# Patient Record
Sex: Male | Born: 1985 | Race: White | Hispanic: No | Marital: Married | Smoking: Former smoker
Health system: Southern US, Community
[De-identification: ages and names within clinical notes are randomized; demographics above are authoritative.]

## PROBLEM LIST (undated history)

## (undated) HISTORY — PX: NO PAST SURGERIES: SHX2092

---

## 2006-12-12 ENCOUNTER — Emergency Department: Payer: Self-pay | Admitting: Emergency Medicine

## 2008-02-26 ENCOUNTER — Emergency Department: Payer: Self-pay | Admitting: Emergency Medicine

## 2008-02-26 ENCOUNTER — Other Ambulatory Visit: Payer: Self-pay

## 2009-04-17 ENCOUNTER — Emergency Department: Payer: Self-pay | Admitting: Emergency Medicine

## 2011-08-04 ENCOUNTER — Ambulatory Visit: Payer: Self-pay | Admitting: Psychiatry

## 2015-04-29 ENCOUNTER — Encounter: Payer: Self-pay | Admitting: Family Medicine

## 2015-04-29 ENCOUNTER — Ambulatory Visit (INDEPENDENT_AMBULATORY_CARE_PROVIDER_SITE_OTHER): Payer: Managed Care, Other (non HMO) | Admitting: Family Medicine

## 2015-04-29 VITALS — BP 130/80 | HR 74 | Temp 98.8°F | Resp 16 | Ht 75.0 in | Wt 181.0 lb

## 2015-04-29 DIAGNOSIS — F419 Anxiety disorder, unspecified: Secondary | ICD-10-CM | POA: Insufficient documentation

## 2015-04-29 DIAGNOSIS — F329 Major depressive disorder, single episode, unspecified: Secondary | ICD-10-CM | POA: Insufficient documentation

## 2015-04-29 DIAGNOSIS — F418 Other specified anxiety disorders: Secondary | ICD-10-CM | POA: Diagnosis not present

## 2015-04-29 DIAGNOSIS — G2581 Restless legs syndrome: Secondary | ICD-10-CM

## 2015-04-29 DIAGNOSIS — F410 Panic disorder [episodic paroxysmal anxiety] without agoraphobia: Secondary | ICD-10-CM

## 2015-04-29 MED ORDER — CLONAZEPAM 1 MG PO TABS: 1.0000 mg | ORAL_TABLET | Freq: Two times a day (BID) | ORAL | Status: DC | PRN

## 2015-04-29 NOTE — Progress Notes (Signed)
       Patient: Edward Blake Male    DOB: [birth year]   29 y.o.   MRN: [id number] Visit Date: 04/29/2015  Today's Provider: Mila Merry, MD   Chief Complaint  Patient presents with  . Follow-up    Medication  . Anxiety   Subjective:    HPI  Follow-up for Anxiety from 11/10/2013; patient is currently taking Clonazepam 1 mg 1-2 tablets prn. This had been prescribed by Dr. Noberto Retort who is no longer following patient. He has been taking the same medication for a few years and we previously discussed getting refills prescribed her since he has been doing so well on current medications. He states he usually takes one clonazepam every morning, and usually 1/2 at night to help sleep.He does admit to occasional use of marijuana, but has agreed to stop since he is taking a controlled substance.    No Known Allergies Previous Medications   CLONAZEPAM (KLONOPIN) 1 MG TABLET    1 tablet 2 (two) times daily as needed.    Review of Systems  Cardiovascular: Negative for chest pain and palpitations.  Neurological: Negative for dizziness, light-headedness and headaches.  Psychiatric/Behavioral: Negative for sleep disturbance.    Social History  Substance Use Topics  . Smoking status: Current Every Day Smoker -- 0.25 packs/day    Types: Cigarettes  . Smokeless tobacco: Current User  . Alcohol Use: 0.0 oz/week    0 Standard drinks or equivalent per week     Comment: RARELY   Objective:   BP 130/80 mmHg  Pulse 74  Temp(Src) 98.8 F (37.1 C) (Oral)  Resp 16  Ht  (1.905 m)  Wt 181 lb (82.101 kg)  BMI 22.62 kg/m2  SpO2 97%  Physical Exam  General appearance: alert, well developed, well nourished, cooperative and in no distress Head: Normocephalic, without obvious abnormality, atraumatic Lungs: Respirations even and unlabored Extremities: No gross deformities Skin: Skin color, texture, turgor normal. No rashes seen  Psych: Appropriate mood and affect. Neurologic: Mental status:  Alert, oriented to person, place, and time, thought content appropriate.     Assessment & Plan:     1. Other specified anxiety disorders Doing well on current dose of clonazepam.  - Pain Management Screening Profile (10S) - clonazePAM (KLONOPIN) 1 MG tablet; Take 1 tablet (1 mg total) by mouth 2 (two) times daily as needed.  Dispense: 60 tablet; Refill: 3       Mila Merry, MD  [facility]

## 2015-05-02 LAB — PMP SCREEN PROFILE (10S), URINE
Amphetamine Screen, Ur: NEGATIVE ng/mL
Barbiturate Screen, Ur: NEGATIVE ng/mL
Benzodiazepine Screen, Urine: NEGATIVE ng/mL
COCAINE(METAB.) SCREEN, URINE: NEGATIVE ng/mL
Cannabinoids Ur Ql Scn: NEGATIVE ng/mL
Creatinine(Crt), U: 56.5 mg/dL (ref 20.0–300.0)
METHADONE SCREEN, URINE: NEGATIVE ng/mL
Opiate Scrn, Ur: NEGATIVE ng/mL
Oxycodone+Oxymorphone Ur Ql Scn: NEGATIVE ng/mL
PCP Scrn, Ur: NEGATIVE ng/mL
Ph of Urine: 6.7 (ref 4.5–8.9)
Propoxyphene, Screen: NEGATIVE ng/mL

## 2015-05-05 ENCOUNTER — Ambulatory Visit: Payer: Self-pay | Admitting: Family Medicine

## 2015-08-18 ENCOUNTER — Other Ambulatory Visit: Payer: Self-pay | Admitting: Family Medicine

## 2015-08-18 DIAGNOSIS — F418 Other specified anxiety disorders: Secondary | ICD-10-CM

## 2015-08-18 NOTE — Telephone Encounter (Signed)
Pt called for refill on his clonazePAM (KLONOPIN) 1 MG tablet... He's not quite out yet.  [facility] [address]  His call back is [phone]  Thanks Barth Kirkseri

## 2015-08-19 MED ORDER — CLONAZEPAM 1 MG PO TABS: 1.0000 mg | ORAL_TABLET | Freq: Two times a day (BID) | ORAL | Status: DC | PRN

## 2015-08-19 NOTE — Telephone Encounter (Signed)
Please call in clonazepam  

## 2015-08-19 NOTE — Telephone Encounter (Signed)
Rx called in to pharmacy. 

## 2015-09-16 ENCOUNTER — Ambulatory Visit: Payer: Managed Care, Other (non HMO) | Admitting: Family Medicine

## 2015-10-14 ENCOUNTER — Encounter: Payer: Self-pay | Admitting: Family Medicine

## 2015-10-14 ENCOUNTER — Ambulatory Visit (INDEPENDENT_AMBULATORY_CARE_PROVIDER_SITE_OTHER): Payer: Managed Care, Other (non HMO) | Admitting: Family Medicine

## 2015-10-14 VITALS — BP 118/74 | HR 93 | Temp 98.1°F | Resp 16 | Ht 75.0 in | Wt 182.0 lb

## 2015-10-14 DIAGNOSIS — Z23 Encounter for immunization: Secondary | ICD-10-CM | POA: Diagnosis not present

## 2015-10-14 DIAGNOSIS — Z720 Tobacco use: Secondary | ICD-10-CM | POA: Insufficient documentation

## 2015-10-14 DIAGNOSIS — F418 Other specified anxiety disorders: Secondary | ICD-10-CM | POA: Diagnosis not present

## 2015-10-14 NOTE — Progress Notes (Signed)
       Patient: Edward Blake Male    DOB: Dec 19, 1985   30 y.o.   MRN: [id number] Visit Date: 10/14/2015  Today's Provider: Mila Merry, MD   Chief Complaint  Patient presents with  . Follow-up  . Anxiety   Subjective:    HPI  Follow-up for anxiety from 04/29/2015; no changes, continue clonazepam 1 mg prn. He always takes once in the morning. Occasional takes one during the day or one at bedtime to help him relax to sleep easier. No adverse effect from medication.    No Known Allergies Previous Medications   CLONAZEPAM (KLONOPIN) 1 MG TABLET    Take 1 tablet (1 mg total) by mouth 2 (two) times daily as needed.    Review of Systems  Constitutional: Negative for fever, chills and appetite change.  Respiratory: Negative for chest tightness, shortness of breath and wheezing.   Cardiovascular: Negative for chest pain and palpitations.  Gastrointestinal: Negative for nausea, vomiting and abdominal pain.    Social History  Substance Use Topics  . Smoking status: Current Every Day Smoker -- 0.25 packs/day    Types: Cigarettes  . Smokeless tobacco: Current User  . Alcohol Use: 0.0 oz/week    0 Standard drinks or equivalent per week     Comment: RARELY   Objective:   BP 118/74 mmHg  Pulse 93  Temp(Src) 98.1 F (36.7 C) (Oral)  Resp 16  Ht  (1.905 m)  Wt 182 lb (82.555 kg)  BMI 22.75 kg/m2  SpO2 99%  Physical Exam  General appearance: alert, well developed, well nourished, cooperative and in no distress Head: Normocephalic, without obvious abnormality, atraumatic Lungs: Respirations even and unlabored Extremities: No gross deformities Skin: Skin color, texture, turgor normal. No rashes seen  Psych: Appropriate mood and affect. Neurologic: Mental status: Alert, oriented to person, place, and time, thought content appropriate.      Assessment & Plan:     1. Other specified anxiety disorders Doing well on current dose of clonazepam. Continue unchanged.   2.  Tobacco abuse Printed educational material regarding health hazards of smoking  3. Need for influenza vaccination  - Flu Vaccine QUAD 36+ mos IM       Mila Merry, MD  [facility]

## 2015-10-14 NOTE — Patient Instructions (Signed)
Call [facility] for appointment for consultations with psychologist: [phone]   Smoking Hazards Smoking cigarettes is extremely bad for your health. Tobacco smoke has over 200 known poisons in it. It contains the poisonous gases nitrogen oxide and carbon monoxide. There are over 60 chemicals in tobacco smoke that cause cancer. Some of the chemicals found in cigarette smoke include:   Cyanide.   Benzene.   Formaldehyde.   Methanol (wood alcohol).   Acetylene (fuel used in welding torches).   Ammonia.  Even smoking lightly shortens your life expectancy by several years. You can greatly reduce the risk of medical problems for you and your family by stopping now. Smoking is the most preventable cause of death and disease in our society. Within days of quitting smoking, your circulation improves, you decrease the risk of having a heart attack, and your lung capacity improves. There may be some increased phlegm in the first few days after quitting, and it may take months for your lungs to clear up completely. Quitting for 10 years reduces your risk of developing lung cancer to almost that of a nonsmoker.  WHAT ARE THE RISKS OF SMOKING? Cigarette smokers have an increased risk of many serious medical problems, including:  Lung cancer.   Lung disease (such as pneumonia, bronchitis, and emphysema).   Heart attack and chest pain due to the heart not getting enough oxygen (angina).   Heart disease and peripheral blood vessel disease.   Hypertension.   Stroke.   Oral cancer (cancer of the lip, mouth, or voice box).   Bladder cancer.   Pancreatic cancer.   Cervical cancer.   Pregnancy complications, including premature birth.   Stillbirths and smaller newborn babies, birth defects, and genetic damage to sperm.   Early menopause.   Lower estrogen level for women.   Infertility.   Facial wrinkles.   Blindness.   Increased risk of  broken bones (fractures).   Senile dementia.   Stomach ulcers and internal bleeding.   Delayed wound healing and increased risk of complications during surgery. Because of secondhand smoke exposure, children of smokers have an increased risk of the following:   Sudden infant death syndrome (SIDS).   Respiratory infections.   Lung cancer.   Heart disease.   Ear infections.  WHY IS SMOKING ADDICTIVE? Nicotine is the chemical agent in tobacco that is capable of causing addiction or dependence. When you smoke and inhale, nicotine is absorbed rapidly into the bloodstream through your lungs. Both inhaled and noninhaled nicotine may be addictive.  WHAT ARE THE BENEFITS OF QUITTING?  There are many health benefits to quitting smoking. Some are:   The likelihood of developing cancer and heart disease decreases. Health improvements are seen almost immediately.   Blood pressure, pulse rate, and breathing patterns start returning to normal soon after quitting.   People who quit may see an improvement in their overall quality of life.  HOW DO YOU QUIT SMOKING? Smoking is an addiction with both physical and psychological effects, and longtime habits can be hard to change. Your health care provider can recommend:  Programs and community resources, which may include group support, education, or therapy.  Replacement products, such as patches, gum, and nasal sprays. Use these products only as directed. Do not replace cigarette smoking with electronic cigarettes (commonly called e-cigarettes). The safety of e-cigarettes is unknown, and some may contain harmful chemicals. FOR MORE INFORMATION  American Lung Association: www.lung.org  American Cancer Society: www.cancer.org   This information  is not intended to replace advice given to you by your health care provider. Make sure you discuss any questions you have with your health care provider.   Document Released: 09/30/2004 Document  Revised: 06/13/2013 Document Reviewed: 02/12/2013 Elsevier Interactive Patient Education [employer]

## 2015-12-03 ENCOUNTER — Telehealth: Payer: Self-pay | Admitting: Family Medicine

## 2015-12-03 DIAGNOSIS — F418 Other specified anxiety disorders: Secondary | ICD-10-CM

## 2015-12-03 MED ORDER — CLONAZEPAM 1 MG PO TABS: 1.0000 mg | ORAL_TABLET | Freq: Two times a day (BID) | ORAL | Status: DC | PRN

## 2015-12-03 NOTE — Telephone Encounter (Signed)
Pt contacted office for refill request on the following medications: clonazePAM (KLONOPIN) [facility] MG [facility] [address] Pt stated he will run out around 12/13/15 but wanted to go ahead and request refill so it will be at pharmacy by the time he needs it. Thanks TNP

## 2015-12-03 NOTE — Telephone Encounter (Signed)
Please call in clonazepam  

## 2016-03-22 ENCOUNTER — Other Ambulatory Visit: Payer: Self-pay | Admitting: Family Medicine

## 2016-03-22 DIAGNOSIS — F418 Other specified anxiety disorders: Secondary | ICD-10-CM

## 2016-03-22 MED ORDER — CLONAZEPAM 1 MG PO TABS: 1.0000 mg | ORAL_TABLET | Freq: Two times a day (BID) | ORAL | Status: DC | PRN

## 2016-03-22 NOTE — Telephone Encounter (Signed)
Please call in clonazepam  

## 2016-03-22 NOTE — Telephone Encounter (Signed)
Pt contacted office for refill request on the following medications:  clonazePAM (KLONOPIN) 1 MG tablet.  [facility] [address]  [phone]#[phone]/MW

## 2016-04-19 ENCOUNTER — Ambulatory Visit (INDEPENDENT_AMBULATORY_CARE_PROVIDER_SITE_OTHER): Payer: Managed Care, Other (non HMO) | Admitting: Family Medicine

## 2016-04-19 ENCOUNTER — Encounter: Payer: Self-pay | Admitting: Family Medicine

## 2016-04-19 VITALS — BP 122/78 | HR 74 | Temp 98.1°F | Resp 16 | Wt 179.0 lb

## 2016-04-19 DIAGNOSIS — F418 Other specified anxiety disorders: Secondary | ICD-10-CM | POA: Diagnosis not present

## 2016-04-19 DIAGNOSIS — S61431A Puncture wound without foreign body of right hand, initial encounter: Secondary | ICD-10-CM | POA: Diagnosis not present

## 2016-04-19 DIAGNOSIS — Z23 Encounter for immunization: Secondary | ICD-10-CM | POA: Diagnosis not present

## 2016-04-19 NOTE — Progress Notes (Signed)
       Patient: Edward RavensJames D Posas Male    DOB: 10/24/1985   30 y.o.   MRN: [id number] Visit Date: 04/19/2016  Today's Provider: Mila Merryonald Mars Scheaffer, MD   Chief Complaint  Patient presents with  . Anxiety    follow up   Subjective:    HPI Follow up Anxiety:  Patient was last seen 6 months ago and no changes were made. Patient reports good compliance with treatment, good tolerance and good symptom control.   He punctured palm of right hand this morning at work. Cleaned it well with alcohol and water. Last tetanus was in 2012.     No Known Allergies Current Meds  Medication Sig  . clonazePAM (KLONOPIN) 1 MG tablet Take 1 tablet (1 mg total) by mouth 2 (two) times daily as needed.    Review of Systems  Constitutional: Negative for appetite change, chills and fever.  Respiratory: Negative for chest tightness, shortness of breath and wheezing.   Cardiovascular: Negative for chest pain and palpitations.  Gastrointestinal: Negative for abdominal pain, nausea and vomiting.  Skin: Positive for wound (right hand).    Social History  Substance Use Topics  . Smoking status: Former Smoker    Packs/day: 0.25    Types: Cigarettes    Start date: 01/05/2016  . Smokeless tobacco: Never Used  . Alcohol use 0.0 oz/week     Comment: RARELY   Objective:   BP 122/78   Pulse 74   Temp 98.1 F (36.7 C) (Oral)   Resp 16   Wt 179 lb (81.2 kg)   SpO2 98% Comment: room air  BMI 22.37 kg/m   GAD 7 : Generalized Anxiety Score 04/19/2016  Nervous, Anxious, on Edge 1  Control/stop worrying 0  Worry too much - different things 1  Trouble relaxing 0  Restless 0  Easily annoyed or irritable 1  Afraid - awful might happen 1  Total GAD 7 Score 4  Anxiety Difficulty Not difficult at all     Physical Exam  General appearance: alert, well developed, well nourished, cooperative and in no distress Head: Normocephalic, without obvious abnormality, atraumatic Respiratory: Respirations even and  unlabored, normal respiratory rate Extremities: No gross deformities Skin: Skin color, texture, turgor normal.Small puncture right proximal palm no erythema, drainage or swelling.  Psych: Appropriate mood and affect. Neurologic: Mental status: Alert, oriented to person, place, and time, thought content appropriate.     Assessment & Plan:     1. Other specified anxiety disorders Doing well on clonazepam. Continue current medications.    2. Puncture wound, hand, right, initial encounter No sign of infection - Td : Tetanus/diphtheria >7yo Preservative  free       Mila Merryonald Marijose Curington, MD  [facility]

## 2016-07-16 ENCOUNTER — Other Ambulatory Visit: Payer: Self-pay | Admitting: Family Medicine

## 2016-07-16 DIAGNOSIS — F418 Other specified anxiety disorders: Secondary | ICD-10-CM

## 2016-07-16 MED ORDER — CLONAZEPAM 1 MG PO TABS: 1.0000 mg | ORAL_TABLET | Freq: Two times a day (BID) | ORAL | 3 refills | Status: DC | PRN

## 2016-07-16 NOTE — Telephone Encounter (Signed)
Pt contacted office for refill request on the following medications: clonazePAM (KLONOPIN) 1 MG tablet [facility] [address] Last written: 03/22/16 with 3 refills Last OV: 04/19/16 Please advise. Thanks TNP

## 2016-07-16 NOTE — Telephone Encounter (Signed)
Rx called in to pharmacy. 

## 2016-07-16 NOTE — Telephone Encounter (Signed)
Please call in clonazepam  

## 2016-11-08 ENCOUNTER — Other Ambulatory Visit: Payer: Self-pay | Admitting: Family Medicine

## 2016-11-08 DIAGNOSIS — F418 Other specified anxiety disorders: Secondary | ICD-10-CM

## 2016-11-08 MED ORDER — CLONAZEPAM 1 MG PO TABS: 1.0000 mg | ORAL_TABLET | Freq: Two times a day (BID) | ORAL | 3 refills | Status: DC | PRN

## 2016-11-08 NOTE — Telephone Encounter (Signed)
Pt requesting refill of clonazePAM (KLONOPIN) 1 MG tablet [facility] [address]  Pt also wants to know when his next appt is needed.

## 2016-11-08 NOTE — Telephone Encounter (Signed)
Med called in

## 2016-11-08 NOTE — Telephone Encounter (Signed)
Pt last OV was 04/2016. Please advise. thanks

## 2017-02-22 ENCOUNTER — Other Ambulatory Visit: Payer: Self-pay | Admitting: Family Medicine

## 2017-02-22 DIAGNOSIS — F418 Other specified anxiety disorders: Secondary | ICD-10-CM

## 2017-02-22 MED ORDER — CLONAZEPAM 1 MG PO TABS: 1.0000 mg | ORAL_TABLET | Freq: Two times a day (BID) | ORAL | 0 refills | Status: DC | PRN

## 2017-02-22 NOTE — Telephone Encounter (Signed)
Please call in clonazepam   Patient needs to schedule follow up office visit.

## 2017-02-22 NOTE — Telephone Encounter (Signed)
Rx called into pharmacy. LMOVM for pt cb and schedule f/u ov.

## 2017-02-22 NOTE — Telephone Encounter (Signed)
Pt contacted office for refill request on the following medications:  clonazePAM (KLONOPIN) 1 MG tablet.  [facility] [address]  CB#[phone]/MW ° °

## 2017-03-07 ENCOUNTER — Ambulatory Visit (INDEPENDENT_AMBULATORY_CARE_PROVIDER_SITE_OTHER): Payer: Managed Care, Other (non HMO) | Admitting: Family Medicine

## 2017-03-07 ENCOUNTER — Encounter: Payer: Self-pay | Admitting: Family Medicine

## 2017-03-07 VITALS — BP 140/84 | HR 84 | Temp 99.3°F | Resp 16 | Ht 75.0 in | Wt 170.0 lb

## 2017-03-07 DIAGNOSIS — F410 Panic disorder [episodic paroxysmal anxiety] without agoraphobia: Secondary | ICD-10-CM | POA: Diagnosis not present

## 2017-03-07 DIAGNOSIS — M549 Dorsalgia, unspecified: Secondary | ICD-10-CM | POA: Insufficient documentation

## 2017-03-07 DIAGNOSIS — M546 Pain in thoracic spine: Secondary | ICD-10-CM

## 2017-03-07 MED ORDER — IBUPROFEN 400 MG PO TABS: ORAL_TABLET | ORAL | 2 refills | Status: DC

## 2017-03-07 MED ORDER — CYCLOBENZAPRINE HCL 5 MG PO TABS: ORAL_TABLET | ORAL | 1 refills | Status: DC

## 2017-03-07 NOTE — Progress Notes (Signed)
Patient: Edward Blake Male    DOB: Dec 06, 1985   31 y.o.   MRN: [id number] Visit Date: 03/07/2017  Today's Provider: Mila Merry, MD   Chief Complaint  Patient presents with  . Follow-up  . Anxiety   Subjective:    Patient wants to discuss his visit to [facility], where he was seen for chest wall pain. Patient also wants to discuss breathing issues that he's been having. Patient states he has been having episodes where he can't catch his breath.   He states he had floating rib that got hung up' and had manipulation done by DO at urgent care, which helped, but is still having constant soreness in back.    Is still smoking a pack every 2-3 days.   Other specified anxiety disorders From 04/19/2016-no changes. Doing well on clonazepam. Continues on clonazepam which he finds effective. Has panic attacks every couple of months mainly defined by feeling anxious and hyperventilating.   No Known Allergies   Current Outpatient Prescriptions:  .  clonazePAM (KLONOPIN) 1 MG tablet, Take 1 tablet (1 mg total) by mouth 2 (two) times daily as needed., Disp: 60 tablet, Rfl: 0  Review of Systems  Constitutional: Negative for appetite change, chills and fever.  Respiratory: Negative for chest tightness, shortness of breath and wheezing.   Cardiovascular: Negative for chest pain and palpitations.  Gastrointestinal: Negative for abdominal pain, nausea and vomiting.    Social History  Substance Use Topics  . Smoking status: Former Smoker    Packs/day: 0.25    Types: Cigarettes    Quit date: 01/05/2016  . Smokeless tobacco: Never Used  . Alcohol use 0.0 oz/week     Comment: RARELY   Objective:   BP (!) 150/90 (BP Location: Right Arm, Patient Position: Sitting, Cuff Size: Normal)   Pulse 84   Temp 99.3 F (37.4 C) (Oral)   Resp 16   Ht 6\' 3"  (1.905 m)   Wt 170 lb (77.1 kg)   SpO2 98%   BMI 21.25 kg/m  Vitals:   03/07/17 1352  BP: (!) 150/90  Pulse: 84  Resp:  16  Temp: 99.3 F (37.4 C)  TempSrc: Oral  SpO2: 98%  Weight: 170 lb (77.1 kg)  Height: 6\' 3"  (1.905 m)   Depression screen [facility] 2/9 03/07/2017  Decreased Interest 0  Down, Depressed, Hopeless 1  PHQ - 2 Score 1  Altered sleeping 0  Tired, decreased energy 0  Change in appetite 0  Feeling bad or failure about yourself  0  Trouble concentrating 0  Moving slowly or fidgety/restless 0  Suicidal thoughts 0  PHQ-9 Score 1  Difficult doing work/chores Not difficult at all     Physical Exam   General Appearance:    Alert, cooperative, no distress  Eyes:    PERRL, conjunctiva/corneas clear, EOM's intact       Lungs:     Clear to auscultation bilaterally, respirations unlabored  Heart:    Regular rate and rhythm  Neurologic:   Awake, alert, oriented x 3. No apparent focal neurological           defect.   MS:   Mild tenderness between left scapula and spine.        Assessment & Plan:     1. Panic disorder Continue current dose of clonazepam. Counseled on slow deep breathing when feeing a panic attack come on. Consider SSRI if they become more frequent.  2. Acute right-sided thoracic back pain Improving slightly since UC visit. Start NSAID and muscle relaxer at bedtime.  - ibuprofen (ADVIL,MOTRIN) 400 MG tablet; One at bedtime and every eight hours during the day as needed  Dispense: 30 tablet; Refill: 2 - cyclobenzaprine (FLEXERIL) 5 MG tablet; 2 at bedtime and 1 every eight hours during day as needed.  Dispense: 30 tablet; Refill: 1       Mila Merryonald Fisher, MD  [facility]

## 2017-03-23 ENCOUNTER — Other Ambulatory Visit: Payer: Self-pay | Admitting: Family Medicine

## 2017-03-23 DIAGNOSIS — F418 Other specified anxiety disorders: Secondary | ICD-10-CM

## 2017-03-23 NOTE — Telephone Encounter (Signed)
Please call in clonazepam  

## 2017-03-24 ENCOUNTER — Telehealth: Payer: Self-pay | Admitting: Family Medicine

## 2017-03-24 DIAGNOSIS — F418 Other specified anxiety disorders: Secondary | ICD-10-CM

## 2017-03-24 NOTE — Telephone Encounter (Signed)
[facility] is [facility] [address] [facility] approve him getting his Rx for Clonzepam refilled 1 day early due to pt going on vacation. Pt would like to pick this up tomorrow. VO#[phone]/QQCB#[phone]/MW

## 2017-03-24 NOTE — Telephone Encounter (Signed)
Rx called in to pharmacy. 

## 2017-03-25 MED ORDER — CLONAZEPAM 1 MG PO TABS: ORAL_TABLET | ORAL | 3 refills | Status: DC

## 2017-03-25 NOTE — Telephone Encounter (Signed)
That's fine

## 2017-03-25 NOTE — Telephone Encounter (Signed)
[facility]. [facility] was printed [facility] pickup [facility] Patient was notified.

## 2017-03-25 NOTE — Telephone Encounter (Signed)
Is this okay to fill early? Please advise.

## 2017-06-22 ENCOUNTER — Ambulatory Visit (INDEPENDENT_AMBULATORY_CARE_PROVIDER_SITE_OTHER): Payer: Managed Care, Other (non HMO) | Admitting: Family Medicine

## 2017-06-22 ENCOUNTER — Encounter: Payer: Self-pay | Admitting: Family Medicine

## 2017-06-22 VITALS — BP 122/62 | HR 95 | Temp 98.3°F | Resp 16 | Ht 75.0 in | Wt 161.0 lb

## 2017-06-22 DIAGNOSIS — F410 Panic disorder [episodic paroxysmal anxiety] without agoraphobia: Secondary | ICD-10-CM

## 2017-06-22 DIAGNOSIS — R634 Abnormal weight loss: Secondary | ICD-10-CM | POA: Diagnosis not present

## 2017-06-22 DIAGNOSIS — Z23 Encounter for immunization: Secondary | ICD-10-CM | POA: Diagnosis not present

## 2017-06-22 DIAGNOSIS — F331 Major depressive disorder, recurrent, moderate: Secondary | ICD-10-CM

## 2017-06-22 MED ORDER — PAROXETINE HCL 10 MG PO TABS: ORAL_TABLET | ORAL | 1 refills | Status: DC

## 2017-06-22 NOTE — Progress Notes (Signed)
error 

## 2017-06-22 NOTE — Progress Notes (Signed)
Patient: Edward RavensJames D Bookbinder Male    DOB: 07/08/1986   31 y.o.   MRN: [id number] Visit Date: 06/22/2017  Today's Provider: Mila Merryonald Glenda Spelman, MD   Chief Complaint  Patient presents with  . Panic Attack   Subjective:    HPI Patient presents today c/o more frequent panic attacks. Patient reports that 2 days ago he had a severe panic attack at work. He reports that he has not had one in several months. He has been taking clonazepam 1mg  as needed up to 2 tablets. He reports that 2 days ago, he had to take 3 tablets in order to calm completely down. He states that he has had some increase stress at work and at home. He reports that he has not had a panic attack since then. He also mentions some associated chest tightness when his panic attack occurred, but it has resolved now. He has also been feeling a little depressed. He is noticed to have steadily lost weight over the last year and states he hasn't had much of an appetite.   He typically takes clonazepam once every morning, and another one a few times a month when he is having more stress than usual.   Wt Readings from Last 5 Encounters:  06/22/17 161 lb (73 kg)  03/07/17 170 lb (77.1 kg)  04/19/16 179 lb (81.2 kg)  10/14/15 182 lb (82.6 kg)  04/29/15 181 lb (82.1 kg)       Dep[facility] screen [facility] 2/9 06/22/2017 03/07/2017  Decreased Interest 0 0  Down, Depressed, Hopeless 0 1  PHQ - 2 Score 0 1  Altered sleeping 0 0  Tired, decreased energy 1 0  Change in appetite 1 0  Feeling bad or failure about yourself  0 0  Trouble concentrating 0 0  Moving slowly or fidgety/restless 0 0  Suicidal thoughts 0 0  PHQ-9 Score 2 1  Difficult doing work/chores Not difficult at all Not difficult at all     No Known Allergies   Current Outpatient Prescriptions:  .  clonazePAM (KLONOPIN) 1 MG tablet, TAKE 1 TABLET BY MOUTH TWICE A DAY AS NEEDED, Disp: 60 tablet, Rfl: 3 .  cyclobenzaprine (FLEXERIL) 5 MG tablet, 2 at bedtime and 1 every eight  hours during day as needed., Disp: 30 tablet, Rfl: 1 .  ibuprofen (ADVIL,MOTRIN) 400 MG tablet, One at bedtime and every eight hours during the day as needed, Disp: 30 tablet, Rfl: 2  Review of Systems  Constitutional: Positive for fatigue. Negative for activity change and appetite change.  Psychiatric/Behavioral: Positive for agitation and sleep disturbance. Negative for behavioral problems, confusion, decreased concentration, dysphoric mood, hallucinations, self-injury and suicidal ideas. The patient is nervous/anxious. The patient is not hyperactive.     Social History  Substance Use Topics  . Smoking status: Former Smoker    Packs/day: 0.25    Types: Cigarettes    Quit date: 01/05/2016  . Smokeless tobacco: Never Used  . Alcohol use 0.0 oz/week     Comment: RARELY   Objective:   BP 122/62 (BP Location: Right Arm, Patient Position: Sitting, Cuff Size: Normal)   Pulse 95   Temp 98.3 F (36.8 C)   Resp 16   Ht 6\' 3"  (1.905 m)   Wt 161 lb (73 kg)   SpO2 99%   BMI 20.12 kg/m     Physical Exam   General Appearance:    Alert, cooperative, no distress  Eyes:    PERRL, conjunctiva/corneas  clear, EOM's intact       Lungs:     Clear to auscultation bilaterally, respirations unlabored  Heart:    Regular rate and rhythm  Neurologic:   Awake, alert, oriented x 3. No apparent focal neurological           defect.           Assessment & Plan:     1. Panic disorder Try adding low dose and slow titration of SSRI. Continue clonazepam for the time being - PARoxetine (PAXIL) 10 MG tablet; 1/2 tablet daily for six days then one daily for six days, then  2 daily  Dispense: 30 tablet; Refill: 1  Return in about 3 weeks (around 07/13/2017).   2. Weight loss  - COMPLETE METABOLIC PANEL WITH GFR - CBC - TSH - HIV antibody (with reflex)  3. Moderate episode of recurrent major depressive disorder (HCC)  - PARoxetine (PAXIL) 10 MG tablet; 1/2 tablet daily for six days then one daily  for six days, then  2 daily  Dispense: 30 tablet; Refill: 1  4. Influenza vaccine needed  - Flu Vaccine QUAD 6+ mos PF IM (Fluarix Quad PF)       Mila Merry, MD  [facility]'

## 2017-06-30 LAB — CBC
HCT: 43.9 % (ref 38.5–50.0)
HEMOGLOBIN: 15.7 g/dL (ref 13.2–17.1)
MCH: 33.6 pg — ABNORMAL HIGH (ref 27.0–33.0)
MCHC: 35.8 g/dL (ref 32.0–36.0)
MCV: 94 fL (ref 80.0–100.0)
MPV: 11.1 fL (ref 7.5–12.5)
PLATELETS: 295 10*3/uL (ref 140–400)
RBC: 4.67 10*6/uL (ref 4.20–5.80)
RDW: 11.9 % (ref 11.0–15.0)
WBC: 7.4 10*3/uL (ref 3.8–10.8)

## 2017-06-30 LAB — COMPLETE METABOLIC PANEL WITH GFR
AG RATIO: 1.9 (calc) (ref 1.0–2.5)
ALT: 15 U/L (ref 9–46)
AST: 18 U/L (ref 10–40)
Albumin: 4.7 g/dL (ref 3.6–5.1)
Alkaline phosphatase (APISO): 41 U/L (ref 40–115)
BILIRUBIN TOTAL: 0.5 mg/dL (ref 0.2–1.2)
BUN: 13 mg/dL (ref 7–25)
CHLORIDE: 102 mmol/L (ref 98–110)
CO2: 29 mmol/L (ref 20–32)
Calcium: 9.4 mg/dL (ref 8.6–10.3)
Creat: 0.92 mg/dL (ref 0.60–1.35)
GFR, Est African American: 129 mL/min/{1.73_m2} (ref >=60)
GFR, Est Non African American: 111 mL/min/{1.73_m2} (ref >=60)
GLUCOSE: 78 mg/dL (ref 65–99)
Globulin: 2.5 g/dL (calc) (ref 1.9–3.7)
Potassium: 3.8 mmol/L (ref 3.5–5.3)
Sodium: 138 mmol/L (ref 135–146)
Total Protein: 7.2 g/dL (ref 6.1–8.1)

## 2017-06-30 LAB — HIV ANTIBODY (ROUTINE TESTING W REFLEX): HIV: NONREACTIVE

## 2017-06-30 LAB — TSH: TSH: 1.02 mIU/L (ref 0.40–4.50)

## 2017-07-10 ENCOUNTER — Encounter: Payer: Self-pay | Admitting: Family Medicine

## 2017-07-14 MED ORDER — SERTRALINE HCL 50 MG PO TABS: ORAL_TABLET | ORAL | 1 refills | Status: DC

## 2017-07-18 ENCOUNTER — Encounter: Payer: Self-pay | Admitting: Family Medicine

## 2017-07-18 ENCOUNTER — Other Ambulatory Visit: Payer: Self-pay | Admitting: Family Medicine

## 2017-07-18 DIAGNOSIS — F418 Other specified anxiety disorders: Secondary | ICD-10-CM

## 2017-07-18 NOTE — Telephone Encounter (Signed)
Please call in clonazepam  

## 2017-07-19 NOTE — Telephone Encounter (Signed)
Called in Rx as below.  

## 2017-07-19 NOTE — Telephone Encounter (Signed)
Done on 07-18-2017

## 2017-07-21 ENCOUNTER — Ambulatory Visit: Payer: Self-pay | Admitting: Family Medicine

## 2017-07-21 ENCOUNTER — Encounter: Payer: Self-pay | Admitting: Family Medicine

## 2017-07-21 NOTE — Progress Notes (Deleted)
       Patient: Edward Blake Male    DOB: 08/05/1986   31 y.o.   MRN: [id number] Visit Date: 07/21/2017  Today's Provider: Mila Merryonald Fisher, MD   No chief complaint on file.  Subjective:    HPI  Panic disorder From 06/22/2017-added low dose and slow titration of SSRI. Continue clonazepam for the time being  Moderate episode of recurrent major depressive disorder (HCC) From 06/22/2017-added Paxil. Changes since last office visit, changed Paxil to sertraline due to side effects.   No Known Allergies   Current Outpatient Medications:  .  clonazePAM (KLONOPIN) 1 MG tablet, TAKE 1 TABLET BY MOUTH TWICE DAILY IF NEEDED, Disp: 60 tablet, Rfl: 3 .  cyclobenzaprine (FLEXERIL) 5 MG tablet, 2 at bedtime and 1 every eight hours during day as needed., Disp: 30 tablet, Rfl: 1 .  ibuprofen (ADVIL,MOTRIN) 400 MG tablet, One at bedtime and every eight hours during the day as needed, Disp: 30 tablet, Rfl: 2 .  sertraline (ZOLOFT) 50 MG tablet, 1/2 tablet daily for 6 days, then 1 daily for six days, then 1 1/2 daily for six days, then 2 daily, Disp: 30 tablet, Rfl: 1  Review of Systems  Constitutional: Negative for appetite change, chills and fever.  Respiratory: Negative for chest tightness, shortness of breath and wheezing.   Cardiovascular: Negative for chest pain and palpitations.  Gastrointestinal: Negative for abdominal pain, nausea and vomiting.    Social History   Tobacco Use  . Smoking status: Former Smoker    Packs/day: 0.25    Types: Cigarettes    Last attempt to quit: 01/05/2016    Years since quitting: 1.5  . Smokeless tobacco: Never Used  Substance Use Topics  . Alcohol use: Yes    Alcohol/week: 0.0 oz    Comment: RARELY   Objective:   There were no vitals taken for this visit. There were no vitals filed for this visit.   Physical Exam      Assessment & Plan:           Mila Merryonald Fisher, MD  [facility]

## 2017-08-04 ENCOUNTER — Ambulatory Visit (INDEPENDENT_AMBULATORY_CARE_PROVIDER_SITE_OTHER): Payer: Managed Care, Other (non HMO) | Admitting: Family Medicine

## 2017-08-04 ENCOUNTER — Encounter: Payer: Self-pay | Admitting: Family Medicine

## 2017-08-04 VITALS — BP 120/70 | HR 66 | Temp 97.9°F | Resp 16 | Ht 75.0 in | Wt 167.0 lb

## 2017-08-04 DIAGNOSIS — F410 Panic disorder [episodic paroxysmal anxiety] without agoraphobia: Secondary | ICD-10-CM

## 2017-08-04 DIAGNOSIS — M778 Other enthesopathies, not elsewhere classified: Secondary | ICD-10-CM

## 2017-08-04 DIAGNOSIS — F418 Other specified anxiety disorders: Secondary | ICD-10-CM

## 2017-08-04 MED ORDER — SERTRALINE HCL 100 MG PO TABS: 100.0000 mg | ORAL_TABLET | Freq: Every day | ORAL | 5 refills | Status: DC

## 2017-08-04 NOTE — Patient Instructions (Signed)
Tendinitis Tendinitis is inflammation of a tendon. A tendon is a strong cord of tissue that connects muscle to bone. Tendinitis can affect any tendon, but it most commonly affects the shoulder tendon (rotator cuff), ankle tendon (Achilles tendon), elbow tendon (triceps tendon), or one of the tendons in the wrist. What are the causes? This condition may be caused by:  Overusing a tendon or muscle. This is common.  Age-related wear and tear.  Injury.  Inflammatory conditions, such as arthritis.  Certain medicines.  What increases the risk? This condition is more likely to develop in people who do activities that involve repetitive motions. What are the signs or symptoms? Symptoms of this condition may include:  Pain.  Tenderness.  Mild swelling.  How is this diagnosed? This condition is diagnosed with a physical exam. You may also have tests, such as:  Ultrasound. This uses sound waves to make an image of your affected area.  MRI.  How is this treated? This condition may be treated by resting, icing, applying pressure (compression), and raising (elevating) the area above the level of your heart. This is known as RICE therapy. Treatment may also include:  Medicines to help reduce inflammation or to help reduce pain.  Exercises or physical therapy to strengthen and stretch the tendon.  A brace or splint.  Surgery (rare).  Follow these instructions at home:  If you have a splint or brace:  Wear the splint or brace as told by your health care provider. Remove it only as told by your health care provider.  Loosen the splint or brace if your fingers or toes tingle, become numb, or turn cold and blue.  Do not take baths, swim, or use a hot tub until your health care provider approves. Ask your health care provider if you can take showers. You may only be allowed to take sponge baths for bathing.  Do not let your splint or brace get wet if it is not waterproof. ? If your  splint or brace is not waterproof, cover it with a watertight plastic bag when you take a bath or a shower.  Keep the splint or brace clean. Managing pain, stiffness, and swelling  If directed, apply ice to the affected area. ? Put ice in a plastic bag. ? Place a towel between your skin and the bag. ? Leave the ice on for 20 minutes, 2-3 times a day.  If directed, apply heat to the affected area as often as told by your health care provider. Use the heat source that your health care provider recommends, such as a moist heat pack or a heating pad. ? Place a towel between your skin and the heat source. ? Leave the heat on for 20-30 minutes. ? Remove the heat if your skin turns bright red. This is especially important if you are unable to feel pain, heat, or cold. You may have a greater risk of getting burned.  Move the fingers or toes of the affected limb often, if this applies. This can help to prevent stiffness and lessen swelling.  If directed, elevate the affected area above the level of your heart while you are sitting or lying down. Driving  Do not drive or operate heavy machinery while taking prescription pain medicine.  Ask your health care provider when it is safe to drive if you have a splint or brace on any part of your arm or leg. Activity  Return to your normal activities as told by your health care   provider. Ask your health care provider what activities are safe for you.  Rest the affected area as told by your health care provider.  Avoid using the affected area while you are experiencing symptoms of tendinitis.  Do exercises as told by your health care provider. General instructions  If you have a splint, do not put pressure on any part of the splint until it is fully hardened. This may take several hours.  Wear an elastic bandage or compression wrap only as told by your health care provider.  Take over-the-counter and prescription medicines only as told by your  health care provider.  Keep all follow-up visits as told by your health care provider. This is important. Contact a health care provider if:  Your symptoms do not improve.  You develop new, unexplained problems, such as numbness in your hands. This information is not intended to replace advice given to you by your health care provider. Make sure you discuss any questions you have with your health care provider. Document Released: 08/20/2000 Document Revised: 04/22/2016 Document Reviewed: 05/26/2015 Elsevier Interactive Patient Education  2018 Elsevier Inc.  

## 2017-08-04 NOTE — Progress Notes (Signed)
       Patient: Edward Blake Male    DOB: 05/03/1986   31 y.o.   MRN: [id number] Visit Date: 08/04/2017  Today's Provider: Mila Merryonald Fisher, MD   Chief Complaint  Patient presents with  . Follow-up  . Depression   Subjective:    HPI  Moderate episode of recurrent major depressive disorder (HCC) and Panic disorder From 06/22/2017-added PARoxetine (PAXIL) 10 MG tablet. Was changed to sertraline about 3 weeks ago due to restless legs and poor appetite. He feels like sertraline is tolerated much better. Has titrated up to 1 1/2 x 50mg  daily feels like mood is more level.    Also having left wrist pain for 2 weeks.Usually hurts when he lifts something heavy or when he's driving. Doesn't recall any specific injury, but has improved quite a bit the last few days.   No Known Allergies   Current Outpatient Medications:  .  clonazePAM (KLONOPIN) 1 MG tablet, TAKE 1 TABLET BY MOUTH TWICE DAILY IF NEEDED, Disp: 60 tablet, Rfl: 3 .  sertraline (ZOLOFT) 50 MG tablet, 1/2 tablet daily for 6 days, then 1 daily for six days, then 1 1/2 daily for six days, then 2 daily, Disp: 30 tablet, Rfl: 1 .  cyclobenzaprine (FLEXERIL) 5 MG tablet, 2 at bedtime and 1 every eight hours during day as needed. (Patient not taking: Reported on 08/04/2017), Disp: 30 tablet, Rfl: 1 .  ibuprofen (ADVIL,MOTRIN) 400 MG tablet, One at bedtime and every eight hours during the day as needed (Patient not taking: Reported on 08/04/2017), Disp: 30 tablet, Rfl: 2  Review of Systems  Constitutional: Negative for appetite change, chills and fever.  Respiratory: Negative for chest tightness, shortness of breath and wheezing.   Cardiovascular: Negative for chest pain and palpitations.  Gastrointestinal: Negative for abdominal pain, nausea and vomiting.  Musculoskeletal: Positive for arthralgias.    Social History   Tobacco Use  . Smoking status: Former Smoker    Packs/day: 0.25    Types: Cigarettes    Last attempt to quit:  01/05/2016    Years since quitting: 1.5  . Smokeless tobacco: Never Used  Substance Use Topics  . Alcohol use: Yes    Alcohol/week: 0.0 oz    Comment: RARELY   Objective:   BP 120/70 (BP Location: Right Arm, Patient Position: Sitting, Cuff Size: Normal)   Pulse 66   Temp 97.9 F (36.6 C) (Oral)   Resp 16   Ht 6\' 3"  (1.905 m)   Wt 167 lb (75.8 kg)   SpO2 99%   BMI 20.87 kg/m     Physical Exam  General appearance: alert, well developed, well nourished, cooperative and in no distress Head: Normocephalic, without obvious abnormality, atraumatic Respiratory: Respirations even and unlabored, normal respiratory rate Extremities: Minimal tenderness dorsum of left medial wrist. No swelling or erythema. FROM with slight pain at limits of wrist flexion and extension against resistance.      Assessment & Plan:     1. Panic disorder Tolerating sertraline better than paroxetine, is to titrate up to 100mg  a day. If continues to do well will follow up in 3 months.  2. Other specified anxiety disorders Continue prn clonazepam.   3. Tendonitis of wrist, left  Improving without treatment. Advise relative rest and expect to completely heal in a few more weeks.       Mila Merryonald Fisher, MD  [facility]

## 2017-08-10 ENCOUNTER — Encounter: Payer: Self-pay | Admitting: Family Medicine

## 2017-08-16 ENCOUNTER — Other Ambulatory Visit: Payer: Self-pay | Admitting: Family Medicine

## 2017-08-16 DIAGNOSIS — M25539 Pain in unspecified wrist: Secondary | ICD-10-CM

## 2017-11-01 ENCOUNTER — Other Ambulatory Visit: Payer: Self-pay | Admitting: Family Medicine

## 2017-11-01 DIAGNOSIS — F410 Panic disorder [episodic paroxysmal anxiety] without agoraphobia: Secondary | ICD-10-CM

## 2017-11-01 MED ORDER — SERTRALINE HCL 100 MG PO TABS: 100.0000 mg | ORAL_TABLET | Freq: Every day | ORAL | 5 refills | Status: DC

## 2017-11-01 NOTE — Telephone Encounter (Signed)
Please review. Thanks!  

## 2017-11-01 NOTE — Telephone Encounter (Signed)
[facility] pharmacy faxed a refill request for a 90-days supply for the following medication. Thanks CC ° °sertraline (ZOLOFT) 100 MG tablet  ° °

## 2017-11-02 ENCOUNTER — Other Ambulatory Visit: Payer: Self-pay | Admitting: Family Medicine

## 2017-11-02 DIAGNOSIS — F410 Panic disorder [episodic paroxysmal anxiety] without agoraphobia: Secondary | ICD-10-CM

## 2017-11-02 MED ORDER — SERTRALINE HCL 100 MG PO TABS: 100.0000 mg | ORAL_TABLET | Freq: Every day | ORAL | 1 refills | Status: DC

## 2017-11-02 NOTE — Telephone Encounter (Signed)
[facility] pharmacy faxed a refill request for a 90-days supply for the following medication. Thanks CC ° °sertraline (ZOLOFT) 100 MG tablet  ° °

## 2017-11-05 ENCOUNTER — Other Ambulatory Visit: Payer: Self-pay | Admitting: Family Medicine

## 2017-11-05 DIAGNOSIS — F418 Other specified anxiety disorders: Secondary | ICD-10-CM

## 2017-11-10 ENCOUNTER — Encounter: Payer: Self-pay | Admitting: Family Medicine

## 2017-11-10 ENCOUNTER — Encounter: Payer: Self-pay | Admitting: *Deleted

## 2017-11-10 ENCOUNTER — Ambulatory Visit (INDEPENDENT_AMBULATORY_CARE_PROVIDER_SITE_OTHER): Payer: Managed Care, Other (non HMO) | Admitting: Family Medicine

## 2017-11-10 VITALS — BP 120/88 | HR 71 | Temp 98.1°F | Resp 16 | Ht 75.0 in | Wt 162.0 lb

## 2017-11-10 DIAGNOSIS — J069 Acute upper respiratory infection, unspecified: Secondary | ICD-10-CM

## 2017-11-10 DIAGNOSIS — F331 Major depressive disorder, recurrent, moderate: Secondary | ICD-10-CM | POA: Diagnosis not present

## 2017-11-10 DIAGNOSIS — F410 Panic disorder [episodic paroxysmal anxiety] without agoraphobia: Secondary | ICD-10-CM | POA: Diagnosis not present

## 2017-11-10 NOTE — Progress Notes (Signed)
Patient: Edward Blake Male    DOB: 07/19/1986   31 y.o.   MRN: [id number] Visit Date: 11/10/2017  Today's Provider: Mila Merryonald Fisher, MD   Chief Complaint  Patient presents with  . Follow-up  . Anxiety  . Depression   Subjective:    HPI  Panic disorder From 08/04/2017-patient was advised to titrate sertraline up to 100mg  a day. If continues to do well will follow up in 3 months.  Other specified anxiety disorders From 08/04/2017-no changes. Continue prn clonazepam. He has been doing well and taking every couple of days for anxious feeling, but has not had any panic attacks.   Also some URI symptoms the last couple of days. Had fever of 102 yesterday morning. Runny nose and headache with persistent non-productive cough. Has had no fever since yesterday morning and not taking any OTC medications. Did miss work yesterday and today.    No Known Allergies   Current Outpatient Medications:  .  clonazePAM (KLONOPIN) 1 MG tablet, TAKE 1 TABLET BY MOUTH TWICE A DAY AS NEEDED, Disp: 60 tablet, Rfl: 3 .  cyclobenzaprine (FLEXERIL) 5 MG tablet, 2 at bedtime and 1 every eight hours during day as needed., Disp: 30 tablet, Rfl: 1 .  ibuprofen (ADVIL,MOTRIN) 400 MG tablet, One at bedtime and every eight hours during the day as needed, Disp: 30 tablet, Rfl: 2 .  sertraline (ZOLOFT) 100 MG tablet, Take 1 tablet (100 mg total) by mouth daily., Disp: 90 tablet, Rfl: 1  Review of Systems  Constitutional: Negative for appetite change, chills and fever.  Respiratory: Negative for chest tightness, shortness of breath and wheezing.   Cardiovascular: Negative for chest pain and palpitations.  Gastrointestinal: Negative for abdominal pain, nausea and vomiting.    Social History   Tobacco Use  . Smoking status: Former Smoker    Packs/day: 0.25    Types: Cigarettes    Last attempt to quit: 01/05/2016    Years since quitting: 1.8  . Smokeless tobacco: Never Used  Substance Use Topics  .  Alcohol use: Yes    Alcohol/week: 0.0 oz    Comment: RARELY   Objective:   BP 120/88 (BP Location: Right Arm, Patient Position: Sitting, Cuff Size: Normal)   Pulse 71   Temp 98.1 F (36.7 C) (Oral)   Resp 16   Ht 6\' 3"  (1.905 m)   Wt 162 lb (73.5 kg)   SpO2 98%   BMI 20.25 kg/m  Vitals:   11/10/17 0818  BP: 120/88  Pulse: 71  Resp: 16  Temp: 98.1 F (36.7 C)  TempSrc: Oral  SpO2: 98%  Weight: 162 lb (73.5 kg)  Height: 6\' 3"  (1.905 m)     Physical Exam  General Appearance:    Alert, cooperative, no distress  HENT:   bilateral TM normal without fluid or infection, neck without nodes, pharynx erythematous without exudate, sinuses nontender and nasal mucosa congested  Eyes:    PERRL, conjunctiva/corneas clear, EOM's intact       Lungs:     Clear to auscultation bilaterally, respirations unlabored  Heart:    Regular rate and rhythm  Neurologic:   Awake, alert, oriented x 3. No apparent focal neurological           defect.           Assessment & Plan:     1. Moderate episode of recurrent major depressive disorder (HCC) Doing much better with increase dose of sertraline and  reducing frequency of clonazepam. Advised to try reducing dose of clonazepam to 1/2 tablet and consider change next prescription to 0.5mg  tablets.   2. Panic disorder Well controlled.  Continue current dose of sertraline.   3. Upper respiratory tract infection, unspecified type Counseled regarding signs and symptoms of viral and bacterial respiratory infections. Advised to call or return for additional evaluation if he develops any sign of bacterial infection, or if current symptoms last longer than 10 days.    Wok excuse given for today and yesterday for URI.       Mila Merry, MD  [facility]

## 2018-02-23 ENCOUNTER — Encounter: Payer: Self-pay | Admitting: Family Medicine

## 2018-02-24 ENCOUNTER — Other Ambulatory Visit: Payer: Self-pay | Admitting: *Deleted

## 2018-02-24 DIAGNOSIS — F418 Other specified anxiety disorders: Secondary | ICD-10-CM

## 2018-02-24 MED ORDER — CLONAZEPAM 1 MG PO TABS: ORAL_TABLET | ORAL | 5 refills | Status: DC

## 2018-02-28 ENCOUNTER — Encounter: Payer: Self-pay | Admitting: Family Medicine

## 2018-03-07 ENCOUNTER — Ambulatory Visit (INDEPENDENT_AMBULATORY_CARE_PROVIDER_SITE_OTHER): Payer: Managed Care, Other (non HMO) | Admitting: Family Medicine

## 2018-03-07 ENCOUNTER — Encounter: Payer: Self-pay | Admitting: Family Medicine

## 2018-03-07 VITALS — BP 104/80 | HR 87 | Temp 98.0°F | Resp 16 | Wt 161.0 lb

## 2018-03-07 DIAGNOSIS — S93492A Sprain of other ligament of left ankle, initial encounter: Secondary | ICD-10-CM | POA: Diagnosis not present

## 2018-03-07 MED ORDER — HYDROCODONE-ACETAMINOPHEN 5-325 MG PO TABS: 1.0000 | ORAL_TABLET | Freq: Three times a day (TID) | ORAL | 0 refills | Status: DC | PRN

## 2018-03-07 MED ORDER — HYDROCODONE-ACETAMINOPHEN 5-325 MG PO TABS: 1.0000 | ORAL_TABLET | Freq: Three times a day (TID) | ORAL | 0 refills | Status: AC | PRN

## 2018-03-07 MED ORDER — NAPROXEN 500 MG PO TABS
500.0000 mg | ORAL_TABLET | Freq: Two times a day (BID) | ORAL | 0 refills | Status: DC
Start: 2018-03-07 — End: 2018-05-15

## 2018-03-07 NOTE — Progress Notes (Signed)
Patient: Edward Blake Male    DOB: Mar 23, 1986   32 y.o.   MRN: [id number] Visit Date: 03/07/2018  Today's Provider: Mila Merry, MD   Chief Complaint  Patient presents with  . Ankle Injury   Subjective:    Ankle Injury   The incident occurred more than 1 week ago (1 month). The incident occurred at home. The injury mechanism was a twisting injury. The pain is present in the right ankle. The quality of the pain is described as aching and stabbing. The pain is at a severity of 5/10. The pain is moderate. The pain has been fluctuating since onset. Associated symptoms include an inability to bear weight. Pertinent negatives include no loss of motion, loss of sensation, muscle weakness, numbness or tingling. He reports no foreign bodies present. The symptoms are aggravated by movement and weight bearing. He has tried ice and rest for the symptoms. The treatment provided mild relief.   Patient twisted his right ankle 1 month ago at home. Patient went to Urgent care at the time and had an x-ray -showing normal. Patient was given naproxen and Vicodin. Patient states after standing at work for a few hours, his ankle will swell up. Patient states he will ice his ankle and rest. But he is still having pain also. Was given air cast and urgent care, which he now puts on after his ankle gets very painful or starts swelling as day goes by.   He was also given prescription for hydrocodone/apap which he has finished, but states sometimes pain will be very severe at night and wake him up so he can't get back to sleep.    No Known Allergies   Current Outpatient Medications:  .  clonazePAM (KLONOPIN) 1 MG tablet, TAKE 1 TABLET BY MOUTH TWICE A DAY AS NEEDED, Disp: 60 tablet, Rfl: 5 .  cyclobenzaprine (FLEXERIL) 5 MG tablet, 2 at bedtime and 1 every eight hours during day as needed., Disp: 30 tablet, Rfl: 1 .  ibuprofen (ADVIL,MOTRIN) 400 MG tablet, One at bedtime and every eight hours during the day  as needed, Disp: 30 tablet, Rfl: 2 .  sertraline (ZOLOFT) 100 MG tablet, Take 1 tablet (100 mg total) by mouth daily., Disp: 90 tablet, Rfl: 1  Review of Systems  Constitutional: Negative for appetite change, chills and fever.  Respiratory: Negative for chest tightness, shortness of breath and wheezing.   Cardiovascular: Negative for chest pain and palpitations.  Gastrointestinal: Negative for abdominal pain, nausea and vomiting.  Musculoskeletal: Positive for joint swelling.  Neurological: Negative for tingling and numbness.    Social History   Tobacco Use  . Smoking status: Former Smoker    Packs/day: 0.25    Types: Cigarettes    Last attempt to quit: 01/05/2016    Years since quitting: 2.1  . Smokeless tobacco: Never Used  Substance Use Topics  . Alcohol use: Yes    Alcohol/week: 0.0 oz    Comment: RARELY   Objective:   BP 104/80 (BP Location: Right Arm, Patient Position: Sitting, Cuff Size: Normal)   Pulse 87   Temp 98 F (36.7 C) (Oral)   Resp 16   Wt 161 lb (73 kg)   SpO2 98%   BMI 20.12 kg/m  Vitals:   03/07/18 1138  BP: 104/80  Pulse: 87  Resp: 16  Temp: 98 F (36.7 C)  TempSrc: Oral  SpO2: 98%  Weight: 161 lb (73 kg)     Physical  Exam  General appearance: alert, well developed, well nourished, cooperative and in no distress Head: Normocephalic, without obvious abnormality, atraumatic Respiratory: Respirations even and unlabored, normal respiratory rate Extremities: mild swelling over left TF joint. Stable ankle joint. No erythema. Able to bear weight and walk without difficulty.      Assessment & Plan:     1. Sprain of anterior talofibular ligament of left ankle, initial encounter Now one month out and slowly healing. Advised to wear elastic ankle wrap or brace at all times during the day. Does not need to use air cast which is very cumbersome and limits his mobility. Expect another 2-4 weeks of healing. Can apply ice at end of day.   Call if  symptoms change or if not rapidly improving.    - naproxen (NAPROSYN) 500 MG tablet; Take 1 tablet (500 mg total) by mouth 2 (two) times daily with a meal.  Dispense: 30 tablet; Refill: 0 - HYDROcodone-acetaminophen (NORCO/VICODIN) 5-325 MG tablet; Take 1 tablet by mouth every 8 (eight) hours as needed for up to 5 days for moderate pain.  Dispense: 10 tablet; Refill: 0       Mila Merryonald Marshun Duva, MD  [facility]

## 2018-04-24 ENCOUNTER — Other Ambulatory Visit: Payer: Self-pay | Admitting: Family Medicine

## 2018-04-24 DIAGNOSIS — F410 Panic disorder [episodic paroxysmal anxiety] without agoraphobia: Secondary | ICD-10-CM

## 2018-05-11 ENCOUNTER — Encounter: Payer: Self-pay | Admitting: Family Medicine

## 2018-05-15 ENCOUNTER — Encounter: Payer: Self-pay | Admitting: Family Medicine

## 2018-05-15 ENCOUNTER — Ambulatory Visit (INDEPENDENT_AMBULATORY_CARE_PROVIDER_SITE_OTHER): Payer: Managed Care, Other (non HMO) | Admitting: Family Medicine

## 2018-05-15 VITALS — BP 121/75 | HR 57 | Temp 98.0°F | Resp 16 | Ht 75.0 in | Wt 167.0 lb

## 2018-05-15 DIAGNOSIS — F410 Panic disorder [episodic paroxysmal anxiety] without agoraphobia: Secondary | ICD-10-CM | POA: Diagnosis not present

## 2018-05-15 DIAGNOSIS — Z23 Encounter for immunization: Secondary | ICD-10-CM

## 2018-05-15 NOTE — Addendum Note (Signed)
Addended by: Malva Limes on: 05/15/2018 08:47 AM   Modules accepted: Level of Service

## 2018-05-15 NOTE — Progress Notes (Signed)
       Patient: Edward Blake Male    DOB: June 22, 1986   32 y.o.   MRN: [id number] Visit Date: 05/15/2018  Today's Provider: Mila Merry, MD   Chief Complaint  Patient presents with  . Follow-up  . Depression   Subjective:    HPI  Moderate episode of recurrent major depressive disorder (HCC) From 11/10/2017-Advised to try reducing dose of clonazepam to 1/2 tablet and consider change next prescription to 0.5mg  tablets.   Panic disorder From 11/10/2017-no changes. Continue current dose of sertraline.    No Known Allergies   Current Outpatient Medications:  .  clonazePAM (KLONOPIN) 1 MG tablet, TAKE 1 TABLET BY MOUTH TWICE A DAY AS NEEDED, Disp: 60 tablet, Rfl: 5 .  sertraline (ZOLOFT) 100 MG tablet, TAKE 1 TABLET BY MOUTH EVERY DAY, Disp: 90 tablet, Rfl: 3  Review of Systems  Constitutional: Negative for appetite change, chills and fever.  Respiratory: Negative for chest tightness, shortness of breath and wheezing.   Cardiovascular: Negative for chest pain and palpitations.  Gastrointestinal: Negative for abdominal pain, nausea and vomiting.    Social History   Tobacco Use  . Smoking status: Former Smoker    Packs/day: 0.25    Types: Cigarettes    Last attempt to quit: 01/05/2016    Years since quitting: 2.3  . Smokeless tobacco: Never Used  Substance Use Topics  . Alcohol use: Yes    Alcohol/week: 0.0 standard drinks    Comment: RARELY   Objective:   BP 121/75 (BP Location: Right Arm, Patient Position: Sitting, Cuff Size: Large)   Pulse (!) 57   Temp 98 F (36.7 C) (Oral)   Resp 16   Ht 6\' 3"  (1.905 m)   Wt 167 lb (75.8 kg)   SpO2 99%   BMI 20.87 kg/m  Vitals:   05/15/18 0815  BP: 121/75  Pulse: (!) 57  Resp: 16  Temp: 98 F (36.7 C)  TempSrc: Oral  SpO2: 99%  Weight: 167 lb (75.8 kg)  Height: 6\' 3"  (1.905 m)     Physical Exam  General appearance: alert, well developed, well nourished, cooperative and in no distress Head: Normocephalic, without  obvious abnormality, atraumatic Respiratory: Respirations even and unlabored, normal respiratory rate Extremities: No gross deformities Skin: Skin color, texture, turgor normal. No rashes seen  Psych: Appropriate mood and affect. Neurologic: Mental status: Alert, oriented to person, place, and time, thought content appropriate.     Assessment & Plan:     1. Panic disorder Doing well with sertraline and prn clonazepam. Continue current medications.    2. Need for influenza vaccination  - Flu Vaccine QUAD 36+ mos IM  Return in about 6 months (around 11/13/2018).       Mila Merry, MD  [facility]

## 2018-08-08 ENCOUNTER — Other Ambulatory Visit: Payer: Self-pay | Admitting: Family Medicine

## 2018-08-08 DIAGNOSIS — F418 Other specified anxiety disorders: Secondary | ICD-10-CM

## 2019-01-20 ENCOUNTER — Other Ambulatory Visit: Payer: Self-pay | Admitting: Family Medicine

## 2019-01-20 DIAGNOSIS — F418 Other specified anxiety disorders: Secondary | ICD-10-CM

## 2019-01-22 NOTE — Telephone Encounter (Signed)
Please review

## 2019-03-04 ENCOUNTER — Encounter: Payer: Self-pay | Admitting: Family Medicine

## 2019-03-06 ENCOUNTER — Ambulatory Visit
Admission: RE | Admit: 2019-03-06 | Discharge: 2019-03-06 | Disposition: A | Discharge status: Home / Self Care | Payer: 59 | Source: Ambulatory Visit | Attending: Family Medicine | Admitting: Family Medicine

## 2019-03-06 ENCOUNTER — Encounter: Payer: Self-pay | Admitting: Family Medicine

## 2019-03-06 ENCOUNTER — Ambulatory Visit (INDEPENDENT_AMBULATORY_CARE_PROVIDER_SITE_OTHER): Payer: 59 | Admitting: Family Medicine

## 2019-03-06 ENCOUNTER — Other Ambulatory Visit: Payer: Self-pay

## 2019-03-06 ENCOUNTER — Other Ambulatory Visit: Payer: Self-pay | Admitting: Family Medicine

## 2019-03-06 VITALS — BP 130/72 | HR 76 | Temp 98.2°F | Wt 181.0 lb

## 2019-03-06 DIAGNOSIS — N451 Epididymitis: Secondary | ICD-10-CM

## 2019-03-06 DIAGNOSIS — N5089 Other specified disorders of the male genital organs: Secondary | ICD-10-CM | POA: Insufficient documentation

## 2019-03-06 MED ORDER — LEVOFLOXACIN 500 MG PO TABS: 500.0000 mg | ORAL_TABLET | Freq: Every day | ORAL | 0 refills | Status: DC

## 2019-03-06 NOTE — Progress Notes (Signed)
Has had swelling in the left testicle/scrotum the past week. Ultrasound/doppler studies showed epididymitis without torsion or testicular cancer. Patient advised of results and will treat with Levofloxacin 500 mg qd #10. Recheck prn.

## 2019-03-06 NOTE — Progress Notes (Signed)
Edward Blake  MRN: [id number] DOB: Jan 02, 1986  Subjective:  HPI   The patient is a 33 year old male who presents for evaluation of left testicular pain.  He states he has been having pain and swelling of the left testicle for about 1 week now.  He was seen at an [facility] and they advised him to go to the ED and have them do an ultrasound.  Due to the recent pandemic the patient deferred the ED but does want to proceed with further work up of the testicle. The patient denies any urinary symptoms.  He has had no known trauma and denies any possible STD exposure.  The patient did state that he has not noticed any blood in the urine or semen but has had some dark pre-ejaculate material.  Patient Active Problem List   Diagnosis Date Noted  . Back pain 03/07/2017  . Current tobacco use 10/14/2015  . Restless leg syndrome 04/29/2015  . Major depressive disorder 04/29/2015  . Panic disorder 04/29/2015  . Anxiety disorder 04/29/2015   No past medical history on file.  Past Surgical History:  Procedure Laterality Date  . NO PAST SURGERIES     Family History  Problem Relation Age of Onset  . Healthy Mother   . Hypertension Father   . Migraines Sister    Social History   Socioeconomic History  . Marital status: Soil scientist    Spouse name: Not on file  . Number of children: Not on file  . Years of education: Not on file  . Highest education level: Not on file  Occupational History  . Not on file  Social Needs  . Financial resource strain: Not on file  . Food insecurity    Worry: Not on file    Inability: Not on file  . Transportation needs    Medical: Not on file    Non-medical: Not on file  Tobacco Use  . Smoking status: Former Smoker    Packs/day: 0.25    Types: Cigarettes    Quit date: 01/05/2016    Years since quitting: 3.1  . Smokeless tobacco: Never Used  Substance and Sexual Activity  . Alcohol use: Yes    Alcohol/week: 0.0 standard drinks   Comment: RARELY  . Drug use: No  . Sexual activity: Not on file  Lifestyle  . Physical activity    Days per week: Not on file    Minutes per session: Not on file  . Stress: Not on file  Relationships  . Social Herbalist on phone: Not on file    Gets together: Not on file    Attends religious service: Not on file    Active member of club or organization: Not on file    Attends meetings of clubs or organizations: Not on file    Relationship status: Not on file  . Intimate partner violence    Fear of current or ex partner: Not on file    Emotionally abused: Not on file    Physically abused: Not on file    Forced sexual activity: Not on file  Other Topics Concern  . Not on file  Social History Narrative  . Not on file   Outpatient Encounter Medications as of 03/06/2019  Medication Sig  . clonazePAM (KLONOPIN) 1 MG tablet TAKE 1 TABLET BY MOUTH TWICE A DAY AS NEEDED  . sertraline (ZOLOFT) 100 MG tablet TAKE 1 TABLET BY MOUTH EVERY DAY  No facility-administered encounter medications on file as of 03/06/2019.    No Known Allergies  Review of Systems  Constitutional: Positive for malaise/fatigue (difficulty sleeping at night due to discomfort causing some fatigue.). Negative for fever.  Genitourinary: Negative for dysuria, frequency, hematuria and urgency.       Left Teste twice as big as the right.  Sore, tender to the touch and firm.     Objective:  BP 130/72 (BP Location: Right Arm, Patient Position: Sitting, Cuff Size: Normal)   Pulse 76   Temp 98.2 F (36.8 C) (Oral)   Wt 181 lb (82.1 kg)   SpO2 99%   BMI 22.62 kg/m   Physical Exam  Constitutional: He is oriented to person, place, and time and well-developed, well-nourished, and in no distress.  HENT:  Head: Normocephalic.  Eyes: Conjunctivae are normal.  Neck: Neck supple.  Cardiovascular: Normal rate and regular rhythm.  Pulmonary/Chest: Effort normal.  Abdominal: Soft. Bowel sounds are normal. He  exhibits no mass. There is no abdominal tenderness.  Genitourinary:    Genitourinary Comments: Hard and slightly tender mass in the upper pole of the left testicle. No hernias or local lymphadenopathy.   Musculoskeletal: Normal range of motion.  Neurological: He is alert and oriented to person, place, and time.  Skin: No rash noted.  Psychiatric: Mood, affect and judgment normal.    Assessment and Plan :   1. Mass of left testicle Developed swelling in the left testicle/scrotum over the past 7-8 days. Went to an Urgent Care Clinic on 03-03-19 and was advised to go to the ER for diagnostic test but refused due to pandemic concerns. Still having tenderness and hardness to the palpable lump. Will schedule for scrotal ultrasound/doppler test to rule out torsion and cancer. No local lymphadenopathy or signs of inguinal hernia. - US Scrotum - US SCROTUM W/DOPPLER

## 2019-04-02 ENCOUNTER — Ambulatory Visit: Payer: Self-pay | Admitting: Nurse Practitioner

## 2019-04-02 ENCOUNTER — Other Ambulatory Visit: Payer: Self-pay

## 2019-04-02 ENCOUNTER — Encounter: Payer: Self-pay | Admitting: Nurse Practitioner

## 2019-04-02 DIAGNOSIS — Z113 Encounter for screening for infections with a predominantly sexual mode of transmission: Secondary | ICD-10-CM

## 2019-04-02 DIAGNOSIS — F331 Major depressive disorder, recurrent, moderate: Secondary | ICD-10-CM | POA: Insufficient documentation

## 2019-04-02 DIAGNOSIS — Z202 Contact with and (suspected) exposure to infections with a predominantly sexual mode of transmission: Secondary | ICD-10-CM

## 2019-04-02 NOTE — Progress Notes (Signed)
Patient here for STD screening.Ahmya Bernick Brewer-Jensen, RN 

## 2019-04-02 NOTE — Patient Instructions (Signed)

## 2019-04-02 NOTE — Progress Notes (Signed)
   STI clinic/screening visit  Subjective:  Edward Blake is a 33 y.o. male being seen today for an STI screening visit - notified of possible exposure to HIV. The patient reports they do have symptoms.  Patient has the following medical conditions:   Patient Active Problem List   Diagnosis Date Noted  . Moderate episode of recurrent major depressive disorder ([place]) 04/02/2019  . Back pain 03/07/2017  . Current tobacco use 10/14/2015  . Restless leg syndrome 04/29/2015  . Major depressive disorder 04/29/2015  . Panic disorder 04/29/2015  . Anxiety disorder 04/29/2015     Chief Complaint  Patient presents with  . SEXUALLY TRANSMITTED DISEASE    HPI  Patient reports - exposure to HIV  See flowsheet for further details and programmatic requirements.    The following portions of the patient's history were reviewed and updated as appropriate: allergies, current medications, past medical history, past social history, past surgical history and problem list.  Objective:  There were no vitals filed for this visit.  Physical Exam Vitals signs reviewed.  Constitutional:      Appearance: Normal appearance.  HENT:     Mouth/Throat:     Pharynx: Oropharynx is clear. Uvula midline.  Neck:     Musculoskeletal: Full passive range of motion without pain, normal range of motion and neck supple.  Genitourinary:    Rectum: Normal.     Comments: GC/Clamydia NAAT sample obtained Lymphadenopathy:     Cervical: No cervical adenopathy.  Skin:    General: Skin is warm and dry.  Neurological:     Mental Status: He is alert.  Psychiatric:        Behavior: Behavior is cooperative.       Assessment and Plan:  Edward Blake is a 33 y.o. male presenting to the [facility] for STI screening  1. Exposure to sexually transmitted disease (STD) Await STD results - due to possible HIV exposure  2. Screening examination for STD (sexually transmitted disease) Await STD  results Client verbalizes understanding and is in agreement with plan of care   Return for PRN.  No future appointments.  Berniece Andreas, NP

## 2019-04-16 ENCOUNTER — Ambulatory Visit: Payer: Self-pay

## 2019-04-16 ENCOUNTER — Other Ambulatory Visit: Payer: Self-pay

## 2019-04-16 DIAGNOSIS — Z717 Human immunodeficiency virus [HIV] counseling: Secondary | ICD-10-CM

## 2019-04-16 DIAGNOSIS — Z708 Other sex counseling: Secondary | ICD-10-CM

## 2019-04-16 NOTE — Progress Notes (Signed)
ROI for 04/02/2019 STI testing results signed and test results given to client per [facility] policy. Shona Needles, RN

## 2019-05-10 ENCOUNTER — Other Ambulatory Visit: Payer: Self-pay | Admitting: Family Medicine

## 2019-05-10 DIAGNOSIS — F418 Other specified anxiety disorders: Secondary | ICD-10-CM

## 2019-06-07 ENCOUNTER — Other Ambulatory Visit: Payer: Self-pay | Admitting: Family Medicine

## 2019-06-07 DIAGNOSIS — F410 Panic disorder [episodic paroxysmal anxiety] without agoraphobia: Secondary | ICD-10-CM

## 2019-07-25 ENCOUNTER — Encounter: Payer: Self-pay | Admitting: Family Medicine

## 2019-08-22 ENCOUNTER — Encounter: Payer: Self-pay | Admitting: Family Medicine

## 2019-08-22 DIAGNOSIS — F410 Panic disorder [episodic paroxysmal anxiety] without agoraphobia: Secondary | ICD-10-CM

## 2019-08-22 DIAGNOSIS — F418 Other specified anxiety disorders: Secondary | ICD-10-CM

## 2019-08-23 MED ORDER — CLONAZEPAM 1 MG PO TABS: 1.0000 mg | ORAL_TABLET | Freq: Two times a day (BID) | ORAL | 3 refills | Status: DC | PRN

## 2019-08-23 MED ORDER — SERTRALINE HCL 100 MG PO TABS: 100.0000 mg | ORAL_TABLET | Freq: Every day | ORAL | 2 refills | Status: DC

## 2019-12-01 ENCOUNTER — Encounter: Payer: Self-pay | Admitting: Family Medicine

## 2019-12-14 ENCOUNTER — Other Ambulatory Visit: Payer: Self-pay | Admitting: Family Medicine

## 2019-12-14 DIAGNOSIS — F418 Other specified anxiety disorders: Secondary | ICD-10-CM

## 2019-12-14 NOTE — Telephone Encounter (Signed)
Requested medication (s) are due for refill today - yes  Requested medication (s) are on the active medication list -yes  Future visit scheduled -no  Last refill: 08/23/19 3 RF  Notes to clinic: Request for non delegated Rx  Requested Prescriptions  Pending Prescriptions Disp Refills   clonazePAM (KLONOPIN) 1 MG tablet [Pharmacy Med Name: clonazePAM 1 MG TABLET] 60 tablet 2    Sig: TAKE ONE TABLET BY MOUTH TWICE A DAY AS NEEDED      Not Delegated - Psychiatry:  Anxiolytics/Hypnotics Failed - 12/14/2019  2:36 PM      Failed - This refill cannot be delegated      Failed - Urine Drug Screen completed in last 360 days.      Failed - Valid encounter within last 6 months    Recent Outpatient Visits           9 months ago Mass of left testicle   [facility] Chrismon, Jodell Cipro, [place]   1 year ago Panic disorder   [facility], Demetrios Isaacs, [place]   1 year ago Sprain of anterior talofibular ligament of left ankle, initial encounter   [facility] Malva Limes, MD   2 years ago Moderate episode of recurrent major depressive disorder [facility])   [facility] Malva Limes, MD   2 years ago Tendonitis of wrist, left   [facility] Malva Limes, MD                  Requested Prescriptions  Pending Prescriptions Disp Refills   clonazePAM (KLONOPIN) 1 MG tablet [Pharmacy Med Name: clonazePAM 1 MG TABLET] 60 tablet 2    Sig: TAKE ONE TABLET BY MOUTH TWICE A DAY AS NEEDED      Not Delegated - Psychiatry:  Anxiolytics/Hypnotics Failed - 12/14/2019  2:36 PM      Failed - This refill cannot be delegated      Failed - Urine Drug Screen completed in last 360 days.      Failed - Valid encounter within last 6 months    Recent Outpatient Visits           9 months ago Mass of left testicle   [facility] Chrismon, Jodell Cipro, [place]   1 year ago Panic disorder   [facility] Fisher,  Demetrios Isaacs, MD   1 year ago Sprain of anterior talofibular ligament of left ankle, initial encounter   [facility] Malva Limes, MD   2 years ago Moderate episode of recurrent major depressive disorder [facility])   [facility] Malva Limes, MD   2 years ago Tendonitis of wrist, left   [facility] Malva Limes, MD

## 2020-01-08 ENCOUNTER — Other Ambulatory Visit: Payer: Self-pay | Admitting: Physician Assistant

## 2020-01-08 DIAGNOSIS — F418 Other specified anxiety disorders: Secondary | ICD-10-CM

## 2020-01-08 NOTE — Telephone Encounter (Signed)
Requested medication (s) are due for refill today: yes  Requested medication (s) are on the active medication list: yes  Last refill:  12/14/19  Future visit scheduled: no  Notes to clinic:  Called pt and LM on VM to call office to schedule an appointment.  Medication not delegated to NT to refill   Requested Prescriptions  Pending Prescriptions Disp Refills   clonazePAM (KLONOPIN) 1 MG tablet [Pharmacy Med Name: clonazePAM 1 MG TABLET] 60 tablet 0    Sig: TAKE ONE TABLET BY MOUTH TWICE A DAY AS NEEDED      Not Delegated - Psychiatry:  Anxiolytics/Hypnotics Failed - 01/08/2020  1:39 PM      Failed - This refill cannot be delegated      Failed - Urine Drug Screen completed in last 360 days.      Failed - Valid encounter within last 6 months    Recent Outpatient Visits           10 months ago Mass of left testicle   [facility] Chrismon, Jodell Cipro, [place]   1 year ago Panic disorder   [facility] Fisher, Demetrios Isaacs, MD   1 year ago Sprain of anterior talofibular ligament of left ankle, initial encounter   [facility] Malva Limes, MD   2 years ago Moderate episode of recurrent major depressive disorder [facility])   [facility] Malva Limes, MD   2 years ago Tendonitis of wrist, left   [facility] Malva Limes, MD

## 2020-01-14 NOTE — Progress Notes (Signed)
Established patient visit   Patient: Edward Blake   DOB: [birth year]   34 y.o. Male  MRN: [id number] Visit Date: 01/15/2020  Today's healthcare provider: Lelon Huh, MD   Chief Complaint  Patient presents with  . Anxiety  . Depression   I,Latasha Walston,acting as a scribe for Lelon Huh, MD.,have documented all relevant documentation on the behalf of Lelon Huh, MD,as directed by  Lelon Huh, MD while in the presence of Lelon Huh, MD.  Subjective    HPI Panic Disorder/ Anxiety, Follow-up  He was last seen for anxiety 05/15/2018. Changes made at last visit include continuing sertraline and prn clonazepam.    He reports good compliance with treatment. He reports good tolerance of treatment. He is not having side effects.   He feels his anxiety is mild and Unchanged since last visit.  Symptoms: No chest pain No difficulty concentrating No dizziness No fatigue No feelings of losing control No insomnia, but restless legs at night No irritable No palpitations No panic attacks No racing thoughts No shortness of breath No sweating No tremors/shakes  Feels like he is having more trouble with restless legs and sometimes in arms. States was previously prescribed gabapentin which didn't help. Tried taking extra 1/2 tablet of clonazepam at night. Has gone back to work at [employer]. Feels like RLS keeping him from getting a good night sleep. Usually takes one clonazepam in the morning.   States he got a new puppy which helps a lot with anxiety and would like letter for emotional support animal, he presents certification that animal is ESA.   He report some stress about his SO being unemployed.   GAD-7 Results GAD-7 Generalized Anxiety Disorder Screening Tool 01/15/2020 04/19/2016  1. Feeling Nervous, Anxious, or on Edge 1 1  2. Not Being Able to Stop or Control Worrying 0 0  3. Worrying Too Much About Different Things 1 1  4. Trouble Relaxing 1 0  5.  Being So Restless it's Hard To Sit Still 1 0  6. Becoming Easily Annoyed or Irritable 1 1  7. Feeling Afraid As If Something Awful Might Happen 0 1  Total GAD-7 Score 5 4  Difficulty At Work, Home, or Getting  Along With Others? Somewhat difficult Not difficult at all    PHQ-9 Scores PHQ9 SCORE ONLY 01/15/2020 06/22/2017 03/07/2017  Score 4 2 1    ---------------------------------------------------------------------------------------------------     Medications: Outpatient Medications Prior to Visit  Medication Sig  . clonazePAM (KLONOPIN) 1 MG tablet TAKE ONE TABLET BY MOUTH TWICE A DAY AS NEEDED  . sertraline (ZOLOFT) 100 MG tablet Take 1 tablet (100 mg total) by mouth daily.   No facility-administered medications prior to visit.    Review of Systems  Constitutional: Negative for appetite change, chills and fever.  Respiratory: Negative for chest tightness, shortness of breath and wheezing.   Cardiovascular: Negative for chest pain and palpitations.  Gastrointestinal: Negative for abdominal pain, nausea and vomiting.      Objective    BP 126/85 (BP Location: Right Arm, Patient Position: Sitting, Cuff Size: Large)   Pulse 97   Temp (!) 97.1 F (36.2 C) (Temporal)   Wt 194 lb 3.2 oz (88.1 kg)   BMI 24.27 kg/m    Physical Exam   General: Appearance:    Well developed, well nourished male in no acute distress  Eyes:    PERRL, conjunctiva/corneas clear, EOM's intact       Lungs:  Clear to auscultation bilaterally, respirations unlabored  Heart:    Normal heart rate. Normal rhythm. No murmurs, rubs, or gallops.   MS:   All extremities are intact.   Neurologic:   Awake, alert, oriented x 3. No apparent focal neurological           defect.         Assessment & Plan     1. Moderate episode of recurrent major depressive disorder (H[facility]) Doing well on current combination of sertraline and occasional clonazepam. Continue current medications.  Will compose letter that his  dog is useful as ESA as he will otherwise be required to pay a pet deposit for his apartment.   2. Restless leg syndrome He finds taking 1/2 clonazepam at bedtime helps. Failed gabapentin. Discussed adding something ropinirole if he has to take clonazepam more often.   3. Other specified anxiety disorders   4. Panic disorder Well controlled.     Return in about 5 months (around 06/16/2020) for Yearly Physical.      The entirety of the information documented in the History of Present Illness, Review of Systems and Physical Exam were personally obtained by me. Portions of this information were initially documented by the CMA and reviewed by me for thoroughness and accuracy.      Mila Merry, MD  [facility] [phone] (phone) [phone] (fax)  [facility]

## 2020-01-15 ENCOUNTER — Other Ambulatory Visit: Payer: Self-pay

## 2020-01-15 ENCOUNTER — Encounter: Payer: Self-pay | Admitting: Family Medicine

## 2020-01-15 ENCOUNTER — Ambulatory Visit (INDEPENDENT_AMBULATORY_CARE_PROVIDER_SITE_OTHER): Payer: Managed Care, Other (non HMO) | Admitting: Family Medicine

## 2020-01-15 VITALS — BP 126/85 | HR 97 | Temp 97.1°F | Wt 194.2 lb

## 2020-01-15 DIAGNOSIS — G2581 Restless legs syndrome: Secondary | ICD-10-CM | POA: Diagnosis not present

## 2020-01-15 DIAGNOSIS — F418 Other specified anxiety disorders: Secondary | ICD-10-CM | POA: Diagnosis not present

## 2020-01-15 DIAGNOSIS — F410 Panic disorder [episodic paroxysmal anxiety] without agoraphobia: Secondary | ICD-10-CM

## 2020-01-15 DIAGNOSIS — F331 Major depressive disorder, recurrent, moderate: Secondary | ICD-10-CM

## 2020-01-15 NOTE — Patient Instructions (Signed)
.   Please review the attached list of medications and notify my office if there are any errors.   . Please bring all of your medications to every appointment so we can make sure that our medication list is the same as yours.   

## 2020-01-21 ENCOUNTER — Telehealth: Payer: Self-pay

## 2020-01-21 NOTE — Telephone Encounter (Signed)
Copied from CRM [id number]. Topic: General - Other >> Jan 21, 2020 11:52 AM Dalphine Handing A wrote: Patient would like a callback in regards to a status update on the letter Dr. Sherrie Mustache was to provide him for his employer.

## 2020-01-22 NOTE — Telephone Encounter (Signed)
Has been done, left letter with CMA to advise patient to pick it up.

## 2020-01-23 ENCOUNTER — Encounter: Payer: Self-pay | Admitting: Family Medicine

## 2020-02-08 ENCOUNTER — Encounter: Payer: Self-pay | Admitting: Family Medicine

## 2020-02-08 ENCOUNTER — Other Ambulatory Visit: Payer: Self-pay

## 2020-02-08 ENCOUNTER — Ambulatory Visit (INDEPENDENT_AMBULATORY_CARE_PROVIDER_SITE_OTHER): Payer: Managed Care, Other (non HMO) | Admitting: Family Medicine

## 2020-02-08 ENCOUNTER — Other Ambulatory Visit: Payer: Self-pay | Admitting: Family Medicine

## 2020-02-08 VITALS — BP 138/80 | HR 86 | Temp 96.9°F | Wt 198.0 lb

## 2020-02-08 DIAGNOSIS — M7631 Iliotibial band syndrome, right leg: Secondary | ICD-10-CM | POA: Diagnosis not present

## 2020-02-08 DIAGNOSIS — F418 Other specified anxiety disorders: Secondary | ICD-10-CM

## 2020-02-08 MED ORDER — MELOXICAM 15 MG PO TABS: 15.0000 mg | ORAL_TABLET | Freq: Every day | ORAL | 0 refills | Status: DC

## 2020-02-08 NOTE — Assessment & Plan Note (Addendum)
New problem, since moving furniture about three weeks ago.  Discussed use of ice and bracing.  Will try Meloxicam 15mg  daily for two weeks with largest meal of the day.  Call if worsening or not improved.  Consider Orthopedic referral if not improved.

## 2020-02-08 NOTE — Progress Notes (Signed)
    I,Laura E Walsh,acting as a scribe for Mila Merry, MD.,have documented all relevant documentation on the behalf of Mila Merry, MD,as directed by  Mila Merry, MD while in the presence of Mila Merry, MD.    Established patient visit   Patient: Edward Blake   DOB: February 22, 1986   34 y.o. Male  MRN: [id number] Visit Date: 02/08/2020  Today's healthcare provider: Mila Merry, MD   Chief Complaint  Patient presents with  . Leg Pain    Right leg, started about three weeks ago.   Subjective    Leg Pain  Incident onset: Started about three weeks ago (about two days after moving furniture) There was no injury mechanism. The pain is present in the right thigh. The quality of the pain is described as aching and shooting. The pain has been improving (but not to baseline.) since onset. Associated symptoms include muscle weakness. Pertinent negatives include no inability to bear weight, loss of motion, loss of sensation, numbness or tingling. The symptoms are aggravated by palpation and weight bearing. He has tried ice, heat and NSAIDs for the symptoms. The treatment provided moderate relief.      Medications: Outpatient Medications Prior to Visit  Medication Sig  . clonazePAM (KLONOPIN) 1 MG tablet TAKE ONE TABLET BY MOUTH TWICE A DAY AS NEEDED  . sertraline (ZOLOFT) 100 MG tablet Take 1 tablet (100 mg total) by mouth daily.   No facility-administered medications prior to visit.    Review of Systems  Musculoskeletal: Positive for gait problem and myalgias. Negative for arthralgias, back pain, joint swelling, neck pain and neck stiffness.  Neurological: Negative for tingling and numbness.      Objective    BP 138/80 (BP Location: Right Arm, Patient Position: Sitting, Cuff Size: Large)   Pulse 86   Temp (!) 96.9 F (36.1 C) (Temporal)   Wt 198 lb (89.8 kg)   SpO2 97%   BMI 24.75 kg/m    General appearance: Well developed, well nourished male, cooperative and in no  acute distress Head: Normocephalic, without obvious abnormality, atraumatic MS: Tender over left lateral mid and distal thigh to level of knee along course of ITB. No swelling or erythema.    No results found for any visits on 02/08/20.  Assessment & Plan     Problem List Items Addressed This Visit      Musculoskeletal and Integument   Iliotibial band syndrome of right side - Primary    New problem, since moving furniture about three weeks ago.  Discussed use of ice and bracing.  Will try Meloxicam 15mg  daily for two weeks with largest meal of the day.  Call if worsening or not improved.  Consider Orthopedic referral if not improved.         Relevant Medications   meloxicam (MOBIC) 15 MG tablet       Return if symptoms worsen or fail to improve.      The entirety of the information documented in the History of Present Illness, Review of Systems and Physical Exam were personally obtained by me. Portions of this information were initially documented by the CMA and reviewed by me for thoroughness and accuracy.      , MD  [facility] [phone] (phone) [phone] (fax)  [facility]

## 2020-02-08 NOTE — Patient Instructions (Addendum)
Iliotibial Band Syndrome  Iliotibial band syndrome (ITBS) is a condition that often causes knee pain. It can also cause pain in the outside of your hip, thigh, and knee. The iliotibial band is a strip of tissue that runs from the outside of your hip and down your thigh to the outside of your knee. Repeatedly bending and straightening your knee can irritate the iliotibial band. What are the causes? This condition is caused by inflammation and irritation from the friction of the iliotibial band moving over the thigh bone (femur) when you repeatedly bend and straighten your knee. What increases the risk? This condition is more likely to develop in people who:  Frequently change elevation during their workouts.  Run very long distances.  Recently increased the length or intensity of their workouts.  Run downhill often, or just started running downhill.  Ride a bike very far or often. You may also be at greater risk if you start a new workout routine without first warming up or if you have a job that requires you to bend, squat, or climb frequently. What are the signs or symptoms? Symptoms of this condition include:  Pain along the outside of your knee that may be worse with activity, especially running or going up and down stairs.  A "snapping" sensation over your knee.  Swelling on the outside of your knee.  Pain or a feeling of tightness in your hip. How is this diagnosed? This condition is diagnosed based on your symptoms, medical history, and physical exam. You may also see a health care provider who specializes in reducing pain and increasing mobility (physical therapist). A physical therapist may do an exam to check your balance, movement, and way of walking or running (gait) to see whether the way you move could contribute to your injury. You may also have tests to measure your strength, flexibility, and range of motion. How is this treated? Treatment for this condition  includes:  Resting and limiting exercise.  Returning to activities gradually.  Doing range-of-motion and strengthening exercises (physical therapy) as told by your health care provider.  Including low-impact activities, such as swimming, in your exercise routine. Follow these instructions at home:  If directed, apply ice to the injured area. ? Put ice in a plastic bag. ? Place a towel between your skin and the bag. ? Leave the ice on for 20 minutes, 2-3 times per day.  Return to your normal activities as told by your health care provider. Ask your health care provider what activities are safe for you.  Keep all follow-up visits with your health care provider. This is important. Contact a health care provider if:  Your pain does not improve or gets worse despite treatment. This information is not intended to replace advice given to you by your health care provider. Make sure you discuss any questions you have with your health care provider. Document Revised: 08/05/2017 Document Reviewed: 09/24/2016 Elsevier Patient Education  2020 Elsevier Inc.  

## 2020-03-05 ENCOUNTER — Other Ambulatory Visit: Payer: Self-pay | Admitting: Family Medicine

## 2020-03-05 DIAGNOSIS — M7631 Iliotibial band syndrome, right leg: Secondary | ICD-10-CM

## 2020-03-05 NOTE — Telephone Encounter (Signed)
Requested medications are due for refill today?  Yes  Requested medications are on active medication list?  Yes  Last Refill:   02/08/2020  # 30 with one no refills   Future visit scheduled? Yes  Notes to Clinic:  Medication failed RX refill protocol due to no Hgb nor Creatinine in the last 360 days.

## 2020-04-02 ENCOUNTER — Other Ambulatory Visit: Payer: Self-pay | Admitting: Family Medicine

## 2020-04-02 DIAGNOSIS — F418 Other specified anxiety disorders: Secondary | ICD-10-CM

## 2020-04-02 NOTE — Telephone Encounter (Signed)
Requested medication (s) are due for refill today: yes  Requested medication (s) are on the active medication list: yes  Last refill:  02/08/20  #60  1 refill  Future visit scheduled: yes  Notes to clinic: Not delegated    Requested Prescriptions  Pending Prescriptions Disp Refills   clonazePAM (KLONOPIN) 1 MG tablet [Pharmacy Med Name: clonazePAM 1 MG TABLET] 60 tablet     Sig: TAKE ONE TABLET BY MOUTH TWICE A DAY AS NEEDED      Not Delegated - Psychiatry:  Anxiolytics/Hypnotics Failed - 04/02/2020  3:43 PM      Failed - This refill cannot be delegated      Failed - Urine Drug Screen completed in last 360 days.      Passed - Valid encounter within last 6 months    Recent Outpatient Visits           1 month ago Iliotibial band syndrome of right side   [facility] Malva Limes, MD   2 months ago Moderate episode of recurrent major depressive disorder [facility])   [facility] Malva Limes, MD   1 year ago Mass of left testicle   [facility] Chrismon, Jodell Cipro, [place]   1 year ago Panic disorder   [facility] Malva Limes, MD   2 years ago Sprain of anterior talofibular ligament of left ankle, initial encounter   [facility] Malva Limes, MD       Future Appointments             In 2 months Fisher, Demetrios Isaacs, MD [facility]

## 2020-05-19 ENCOUNTER — Ambulatory Visit: Payer: Managed Care, Other (non HMO) | Admitting: Family Medicine

## 2020-05-19 NOTE — Progress Notes (Deleted)
     Established patient visit   Patient: Edward Blake   DOB: 1986/02/11   34 y.o. Male  MRN: [id number] Visit Date: 05/19/2020  Today's healthcare provider: Mila Merry, MD   No chief complaint on file.  Subjective    HPI  Follow up for leg pain:  The patient was last seen for Iliotibial band syndrome of right side on 02/08/2020.  Changes made at last visit include trying Meloxicam 15mg  daily for two weeks. Discussed use of ice and bracing. Would consider Orthopedic referral if not improved  He reports {excellent/good/fair/poor:19665} compliance with treatment. He feels that condition is {improved/worse/unchanged:3041574}. He {is/is not:21021397} having side effects. ***  -----------------------------------------------------------------------------------------   {Show patient history (optional):23778::" "}   Medications: Outpatient Medications Prior to Visit  Medication Sig  . clonazePAM (KLONOPIN) 1 MG tablet TAKE ONE TABLET BY MOUTH TWICE A DAY AS NEEDED  . meloxicam (MOBIC) 15 MG tablet TAKE ONE TABLET BY MOUTH DAILY  . sertraline (ZOLOFT) 100 MG tablet Take 1 tablet (100 mg total) by mouth daily.   No facility-administered medications prior to visit.    Review of Systems  {Heme  Chem  Endocrine  Serology  Results Review (optional):23779::" "}  Objective    There were no vitals taken for this visit. {Show previous vital signs (optional):23777::" "}  Physical Exam  ***  No results found for any visits on 05/19/20.  Assessment & Plan     ***  No follow-ups on file.      {provider attestation***:1}   05/21/20, MD  [facility] [phone] (phone) [phone] (fax)  [facility]

## 2020-06-23 NOTE — Progress Notes (Deleted)
Complete physical exam   Patient: Edward Blake   DOB: [birth year]   34 y.o. Male  MRN: [id number] Visit Date: 06/24/2020  Today's healthcare provider: Lelon Huh, MD   No chief complaint on file.  Subjective    Edward Blake is a 34 y.o. male who presents today for a complete physical exam.  He reports consuming a {diet types:17450} diet. {Exercise:19826} He generally feels {well/fairly well/poorly:18703}. He reports sleeping {well/fairly well/poorly:18703}. He {does/does not:200015} have additional problems to discuss today.  HPI  ***  No past medical history on file. Past Surgical History:  Procedure Laterality Date  . NO PAST SURGERIES     Social History   Socioeconomic History  . Marital status: Soil scientist    Spouse name: Not on file  . Number of children: Not on file  . Years of education: Not on file  . Highest education level: Not on file  Occupational History  . Not on file  Tobacco Use  . Smoking status: Former Smoker    Packs/day: 0.25    Types: Cigarettes    Quit date: 01/05/2016    Years since quitting: 4.4  . Smokeless tobacco: Never Used  Substance and Sexual Activity  . Alcohol use: Yes    Alcohol/week: 0.0 standard drinks    Comment: RARELY  . Drug use: No  . Sexual activity: Not on file  Other Topics Concern  . Not on file  Social History Narrative  . Not on file   Social Determinants of Health   Financial Resource Strain:   . Difficulty of Paying Living Expenses: Not on file  Food Insecurity:   . Worried About Charity fundraiser in the Last Year: Not on file  . Ran Out of Food in the Last Year: Not on file  Transportation Needs:   . Lack of Transportation (Medical): Not on file  . Lack of Transportation (Non-Medical): Not on file  Physical Activity:   . Days of Exercise per Week: Not on file  . Minutes of Exercise per Session: Not on file  Stress:   . Feeling of Stress : Not on file  Social Connections:   . Frequency of  Communication with Friends and Family: Not on file  . Frequency of Social Gatherings with Friends and Family: Not on file  . Attends Religious Services: Not on file  . Active Member of Clubs or Organizations: Not on file  . Attends Archivist Meetings: Not on file  . Marital Status: Not on file  Intimate Partner Violence:   . Fear of Current or Ex-Partner: Not on file  . Emotionally Abused: Not on file  . Physically Abused: Not on file  . Sexually Abused: Not on file   Family Status  Relation Name Status  . Mother  Alive  . Father  Alive  . Sister  Alive   Family History  Problem Relation Age of Onset  . Healthy Mother   . Hypertension Father   . Migraines Sister    Allergies  Allergen Reactions  . Other Rash    Patient Care Team: Birdie Sons, MD as PCP - General (Family Medicine)   Medications: Outpatient Medications Prior to Visit  Medication Sig  . clonazePAM (KLONOPIN) 1 MG tablet TAKE ONE TABLET BY MOUTH TWICE A DAY AS NEEDED  . meloxicam (MOBIC) 15 MG tablet TAKE ONE TABLET BY MOUTH DAILY  . sertraline (ZOLOFT) 100 MG tablet Take 1 tablet (100 mg  total) by mouth daily.   No facility-administered medications prior to visit.    Review of Systems  Constitutional: Negative.   HENT: Negative.   Eyes: Negative.   Respiratory: Negative.   Cardiovascular: Negative.   Gastrointestinal: Negative.   Genitourinary: Negative.   Musculoskeletal: Negative.   Skin: Negative.   Allergic/Immunologic: Negative.   Neurological: Negative.   Hematological: Negative.   Psychiatric/Behavioral: Negative.     {Heme  Chem  Endocrine  Serology  Results Review (optional):23779::" "}  Objective    There were no vitals taken for this visit. {Show previous vital signs (optional):23777::" "}  Physical Exam  ***  Last depression screening scores PHQ 2/9 Scores 01/15/2020 06/22/2017 03/07/2017  PHQ - 2 Score 2 0 1  PHQ- 9 Score 4 2 1    Last fall risk  screening Fall Risk  01/15/2020  Falls in the past year? 0  Number falls in past yr: 0  Injury with Fall? 0   Last Audit-C alcohol use screening Alcohol Use Disorder Test (AUDIT) 05/15/2018  1. How often do you have a drink containing alcohol? 3  2. How many drinks containing alcohol do you have on a typical day when you are drinking? 1  3. How often do you have six or more drinks on one occasion? 1  AUDIT-C Score 5   A score of 3 or more in women, and 4 or more in men indicates increased risk for alcohol abuse, EXCEPT if all of the points are from question 1   No results found for any visits on 06/24/20.  Assessment & Plan    Routine Health Maintenance and Physical Exam  Exercise Activities and Dietary recommendations Goals   None     Immunization History  Administered Date(s) Administered  . DTaP 03/14/1987, 05/16/1987, 07/18/1987, 07/16/1988, 02/07/1992  . Hepatitis B 07/03/1998, 07/30/1998, 01/02/1999  . IPV 03/14/1987, 05/16/1987, 07/16/1988, 02/07/1992  . Influenza,inj,Quad PF,6+ Mos 10/14/2015, 06/22/2017, 05/15/2018  . MMR 04/23/1988  . Td 09/25/2001, 04/19/2016  . Tdap 05/28/2011    Health Maintenance  Topic Date Due  . Hepatitis C Screening  Never done  . COVID-19 Vaccine (1) Never done  . INFLUENZA VACCINE  04/06/2020  . TETANUS/TDAP  04/19/2026  . HIV Screening  Completed    Discussed health benefits of physical activity, and encouraged him to engage in regular exercise appropriate for his age and condition.  ***  No follow-ups on file.     {provider attestation***:1}   Lelon Huh, MD  [facility] [phone] (phone) [phone] (fax)  [place]

## 2020-06-24 ENCOUNTER — Encounter: Payer: Managed Care, Other (non HMO) | Admitting: Family Medicine

## 2020-08-08 ENCOUNTER — Encounter: Payer: Self-pay | Admitting: Family Medicine

## 2020-08-08 DIAGNOSIS — F410 Panic disorder [episodic paroxysmal anxiety] without agoraphobia: Secondary | ICD-10-CM

## 2020-08-08 MED ORDER — SERTRALINE HCL 100 MG PO TABS: 100.0000 mg | ORAL_TABLET | Freq: Every day | ORAL | 2 refills | Status: DC

## 2020-09-15 NOTE — Patient Instructions (Incomplete)
.   Please review the attached list of medications and notify my office if there are any errors.   . Please bring all of your medications to every appointment so we can make sure that our medication list is the same as yours.   

## 2020-09-23 ENCOUNTER — Telehealth (INDEPENDENT_AMBULATORY_CARE_PROVIDER_SITE_OTHER): Payer: Managed Care, Other (non HMO) | Admitting: Family Medicine

## 2020-09-23 ENCOUNTER — Encounter: Payer: Self-pay | Admitting: Family Medicine

## 2020-09-23 ENCOUNTER — Other Ambulatory Visit: Payer: Self-pay

## 2020-09-23 ENCOUNTER — Ambulatory Visit: Payer: Managed Care, Other (non HMO) | Admitting: Family Medicine

## 2020-09-23 DIAGNOSIS — R198 Other specified symptoms and signs involving the digestive system and abdomen: Secondary | ICD-10-CM | POA: Diagnosis not present

## 2020-09-23 DIAGNOSIS — R0989 Other specified symptoms and signs involving the circulatory and respiratory systems: Secondary | ICD-10-CM

## 2020-09-23 DIAGNOSIS — F410 Panic disorder [episodic paroxysmal anxiety] without agoraphobia: Secondary | ICD-10-CM | POA: Diagnosis not present

## 2020-09-23 DIAGNOSIS — R221 Localized swelling, mass and lump, neck: Secondary | ICD-10-CM | POA: Diagnosis not present

## 2020-09-23 NOTE — Patient Instructions (Addendum)
.   We'll put in a referral to [facility] to check out the lump on the back of your neck.    Try taking OTC Zyrtec (cetirizine 10mg ) and OTC Pepcid (famotidine 10-20mg ) every night at bedtime.

## 2020-09-23 NOTE — Progress Notes (Signed)
MyChart Video Visit    Virtual Visit via Video Note   This visit type was conducted due to national recommendations for restrictions regarding the COVID-19 Pandemic (e.g. social distancing) in an effort to limit this patient's exposure and mitigate transmission in our community. This patient is at least at moderate risk for complications without adequate follow up. This format is felt to be most appropriate for this patient at this time. Physical exam was limited by quality of the video and audio technology used for the visit.   Patient location: home Provider location: [facility]  I discussed the limitations of evaluation and management by telemedicine and the availability of in person appointments. The patient expressed understanding and agreed to proceed.  Patient: Edward Blake   DOB: 04-25-1986   35 y.o. Male  MRN: [id number] Visit Date: 09/23/2020  Today's healthcare provider: Mila Merry, MD   Chief Complaint  Patient presents with  . Depression  . Mass   Subjective    HPI  Depression, Follow-up  He  was last seen for this 8 months ago. Changes made at last visit include none; continue same medication.   He reports good compliance with treatment. He is not having side effects.   He reports good tolerance of treatment. He feels he is Unchanged since last visit. s.   Depression screen [facility] 2/9 09/23/2020 01/15/2020 06/22/2017  Decreased Interest - 1 0  Down, Depressed, Hopeless - 1 0  PHQ - 2 Score - 2 0  Altered sleeping - 1 0  Tired, decreased energy - 1 1  Change in appetite 0 0 1  Feeling bad or failure about yourself  - 0 0  Trouble concentrating 0 0 0  Moving slowly or fidgety/restless - 0 0  Suicidal thoughts - 0 0  PHQ-9 Score - 4 2  Difficult doing work/chores - Not difficult at all Not difficult at all     Neck mass: Patient complains of a mass on the left posterior neck. He reports the mass appeared at least 4 months ago and is unchanged in size. He  reports the mass is about the size of a grape. Is not at painful, and no redness. Is about the size of grape. Is around occipital nymph nodes on the right. Does not visibly protrude from skin and skin seems to slide over the nodule.   Also complains of gagging feeling in the mornings, especially on days he has to work. Not really nauseated and doesn't have to vomiting. Usually goes away after 10 or 15 minutes and doesn't bother him the rest of the day. Has been happening more consistently lately, even on days he doesn't have to work. Doesn't feel any sinus or post nasal drainage. No heartburn.      Medications: Outpatient Medications Prior to Visit  Medication Sig  . clonazePAM (KLONOPIN) 1 MG tablet TAKE ONE TABLET BY MOUTH TWICE A DAY AS NEEDED  . sertraline (ZOLOFT) 100 MG tablet Take 1 tablet (100 mg total) by mouth daily.  . [DISCONTINUED] meloxicam (MOBIC) 15 MG tablet TAKE ONE TABLET BY MOUTH DAILY   No facility-administered medications prior to visit.    Review of Systems  Constitutional: Negative for appetite change, chills and fever.  HENT:       Gagging  Respiratory: Negative for chest tightness, shortness of breath and wheezing.   Cardiovascular: Negative for chest pain and palpitations.  Gastrointestinal: Negative for abdominal pain, nausea and vomiting.      Objective  There were no vitals taken for this visit.   Physical Exam   Awake, alert, oriented x 3. In no apparent distress   Assessment & Plan     1. Lump in neck Persistent for at least 4 months. Sebaceous cyst versus enlarged occipital lymph node, favor lymph node. No know inflammatory triggers. Will have surgery evaluate.  - Ambulatory referral to General Surgery  2. Globus sensation Morning anxiety verus reflux versus post nasal drainage. Try combination OTC Zyrtec and famotidine at bedtime.   3. Panic disorder Doing well with current medication regiment.    No follow-ups on file.     I  discussed the assessment and treatment plan with the patient. The patient was provided an opportunity to ask questions and all were answered. The patient agreed with the plan and demonstrated an understanding of the instructions.   The patient was advised to call back or seek an in-person evaluation if the symptoms worsen or if the condition fails to improve as anticipated.  I provided 14 minutes of non-face-to-face time during this encounter.  The entirety of the information documented in the History of Present Illness, Review of Systems and Physical Exam were personally obtained by me. Portions of this information were initially documented by the CMA and reviewed by me for thoroughness and accuracy.     Mila Merry, MD [facility] [phone] (phone) [phone] (fax)  [facility]

## 2020-09-23 NOTE — Progress Notes (Deleted)
      Established patient visit   Patient: Edward Blake   DOB: Mar 19, 1986   35 y.o. Male  MRN: [id number] Visit Date: 09/23/2020  Today's healthcare provider: Mila Merry, MD   No chief complaint on file.  Subjective    HPI  Neck mass: Patient is here of an evaluation of a mass on the left posterior neck.   Skin check: Patient reports having a suspicious mole on his back.   {Show patient history (optional):23778::" "}   Medications: Outpatient Medications Prior to Visit  Medication Sig  . clonazePAM (KLONOPIN) 1 MG tablet TAKE ONE TABLET BY MOUTH TWICE A DAY AS NEEDED  . meloxicam (MOBIC) 15 MG tablet TAKE ONE TABLET BY MOUTH DAILY  . sertraline (ZOLOFT) 100 MG tablet Take 1 tablet (100 mg total) by mouth daily.   No facility-administered medications prior to visit.    Review of Systems  Constitutional: Negative for appetite change, chills and fever.  Respiratory: Negative for chest tightness, shortness of breath and wheezing.   Cardiovascular: Negative for chest pain and palpitations.  Gastrointestinal: Negative for abdominal pain, nausea and vomiting.  Skin:       Neck mass and abnormal mole on back    {Labs  Heme  Chem  Endocrine  Serology  Results Review (optional):23779::" "}  Objective    There were no vitals taken for this visit. {Show previous vital signs (optional):23777::" "}  Physical Exam  ***  No results found for any visits on 09/23/20.  Assessment & Plan     ***  No follow-ups on file.      {provider attestation***:1}   Mila Merry, MD  [facility] [phone] (phone) [phone] (fax)  [facility]

## 2020-09-24 ENCOUNTER — Telehealth: Payer: Managed Care, Other (non HMO) | Admitting: Family Medicine

## 2020-09-29 ENCOUNTER — Other Ambulatory Visit: Payer: Self-pay | Admitting: Family Medicine

## 2020-09-29 DIAGNOSIS — F418 Other specified anxiety disorders: Secondary | ICD-10-CM

## 2020-09-29 NOTE — Telephone Encounter (Signed)
Requested medication (s) are due for refill today: yes   Requested medication (s) are on the active medication list: yes  Last refill:  08/31/2020  Future visit scheduled: no  Notes to clinic:  this refill cannot be delegated   Requested Prescriptions  Pending Prescriptions Disp Refills   clonazePAM (KLONOPIN) 1 MG tablet [Pharmacy Med Name: clonazePAM 1 MG TABLET] 60 tablet     Sig: TAKE ONE TABLET BY MOUTH TWICE A DAY AS NEEDED      Not Delegated - Psychiatry:  Anxiolytics/Hypnotics Failed - 09/29/2020  5:22 PM      Failed - This refill cannot be delegated      Failed - Urine Drug Screen completed in last 360 days      Passed - Valid encounter within last 6 months    Recent Outpatient Visits           6 days ago Lump in neck   [facility] Malva Limes, MD   7 months ago Iliotibial band syndrome of right side   [facility] Malva Limes, MD   8 months ago Moderate episode of recurrent major depressive disorder [facility])   [facility] Malva Limes, MD   1 year ago Mass of left testicle   [facility] Chrismon, Jodell Cipro, PA-C   2 years ago Panic disorder   [facility] Sherrie Mustache, Demetrios Isaacs, MD

## 2020-09-30 ENCOUNTER — Encounter: Payer: Self-pay | Admitting: General Surgery

## 2020-09-30 ENCOUNTER — Ambulatory Visit (INDEPENDENT_AMBULATORY_CARE_PROVIDER_SITE_OTHER): Payer: Managed Care, Other (non HMO) | Admitting: General Surgery

## 2020-09-30 ENCOUNTER — Other Ambulatory Visit: Payer: Self-pay

## 2020-09-30 VITALS — BP 167/103 | HR 73 | Temp 98.4°F | Ht 75.0 in | Wt 193.8 lb

## 2020-09-30 DIAGNOSIS — L723 Sebaceous cyst: Secondary | ICD-10-CM | POA: Diagnosis not present

## 2020-09-30 NOTE — Patient Instructions (Signed)
Please call our office if you have any questions or concerns.  

## 2020-09-30 NOTE — Progress Notes (Signed)
Patient ID: Edward Blake, male   DOB: June 11, 1986, 35 y.o.   MRN: [id number]  Chief Complaint  Patient presents with  . New Patient (Initial Visit)    Left neck mass    HPI Edward HEIDEMAN is a 35 y.o. male.   He has been referred by his primary care provider, Dr. Mila Merry, for further evaluation of a posterior left neck mass.  Mr. Scheib states that it has been present for about 6 months.  He says that it has been stable in size.  It has never become hot, red, or painful.  There is never been any drainage from the site.  He denies any other similar lumps on his body.  No fevers or night sweats.  No unexpected weight loss or inappetence.  No recent oral or ear infections.   History reviewed. No pertinent past medical history.  Past Surgical History:  Procedure Laterality Date  . NO PAST SURGERIES      Family History  Problem Relation Age of Onset  . Healthy Mother   . Hypertension Father   . Migraines Sister     Social History Social History   Tobacco Use  . Smoking status: Former Smoker    Packs/day: 0.25    Types: Cigarettes, E-cigarettes  . Smokeless tobacco: Never Used  . Tobacco comment: 3 cigarettes per week  Substance Use Topics  . Alcohol use: Yes    Alcohol/week: 0.0 standard drinks    Comment: RARELY  . Drug use: No    Allergies  Allergen Reactions  . Other Rash    Current Outpatient Medications  Medication Sig Dispense Refill  . clonazePAM (KLONOPIN) 1 MG tablet TAKE ONE TABLET BY MOUTH TWICE A DAY AS NEEDED 60 tablet 5  . sertraline (ZOLOFT) 100 MG tablet Take 1 tablet (100 mg total) by mouth daily. 90 tablet 2   No current facility-administered medications for this visit.    Review of Systems Review of Systems  All other systems reviewed and are negative. Or as discussed in the history of present illness.  Blood pressure (!) 167/103, pulse 73, temperature 98.4 F (36.9 C), temperature source Oral, height 6\' 3"  (1.905 m), weight 193 lb 12.8 oz  (87.9 kg), SpO2 97 %.  Physical Exam Physical Exam Vitals reviewed.  Constitutional:      General: He is not in acute distress.    Appearance: Normal appearance. He is normal weight.  HENT:     Head: Normocephalic and atraumatic.     Nose:     Comments: Covered with a mask    Mouth/Throat:     Comments: Covered with a mask Eyes:     General: No scleral icterus.       Right eye: No discharge.        Left eye: No discharge.     Conjunctiva/sclera: Conjunctivae normal.  Neck:     Comments: The trachea is midline.  No dominant thyroid masses or thyromegaly appreciated.  The gland moves freely with deglutition. Cardiovascular:     Rate and Rhythm: Normal rate and regular rhythm.     Pulses: Normal pulses.     Heart sounds: No murmur heard.   Pulmonary:     Effort: Pulmonary effort is normal. No respiratory distress.     Breath sounds: Normal breath sounds.  Chest:  Breasts:     Right: No axillary adenopathy or supraclavicular adenopathy.     Left: No axillary adenopathy or supraclavicular adenopathy.  Abdominal:     General: Bowel sounds are normal.     Palpations: Abdomen is soft.  Genitourinary:    Comments: Deferred Musculoskeletal:        General: No swelling or tenderness.     Cervical back: Neck supple. No rigidity.  Lymphadenopathy:     Head:     Right side of head: No submandibular, preauricular, posterior auricular or occipital adenopathy.     Left side of head: No submandibular, preauricular, posterior auricular or occipital adenopathy.     Cervical: No cervical adenopathy.     Right cervical: No superficial, deep or posterior cervical adenopathy.    Left cervical: No superficial, deep or posterior cervical adenopathy.     Upper Body:     Right upper body: No supraclavicular, axillary or pectoral adenopathy.     Left upper body: No supraclavicular, axillary or pectoral adenopathy.  Skin:    General: Skin is warm and dry.          Comments: 1 cm,  well-circumscribed, mobile intracutaneous mass on the posterior left neck.  Nontender.  Neurological:     General: No focal deficit present.     Mental Status: He is alert and oriented to person, place, and time.  Psychiatric:        Mood and Affect: Mood normal.        Behavior: Behavior normal.     Data Reviewed There are no recent labs within the electronic medical record to review.  I reviewed Dr. Theodis Aguas video visit note from September 23, 2020.  In this note, he describes the lump in the patient's neck and postulates that it may be a sebaceous cyst versus an enlarged occipital lymph node.  Based upon this video visit, the patient was referred for surgical evaluation.  Assessment This is a 35 year old man with a small posterior neck mass.  On physical examination, it is most consistent with a sebaceous cyst.  It is not bothering him.  Plan I discussed epidermal inclusion cysts and explained what they are with the patient and his significant other, who accompanied him today.  As this lesion is not bothering the patient, we can certainly leave it in situ and address it should it start causing him bothersome symptoms in the future.  I do not feel any aspiration is warranted.  He is welcome to contact our office at any time for further evaluation and potential excision.  I will otherwise see him on an as-needed basis.    Edward Blake 09/30/2020, 9:21 AM

## 2020-10-31 ENCOUNTER — Ambulatory Visit (INDEPENDENT_AMBULATORY_CARE_PROVIDER_SITE_OTHER): Payer: Managed Care, Other (non HMO) | Admitting: Physician Assistant

## 2020-10-31 ENCOUNTER — Encounter: Payer: Self-pay | Admitting: Physician Assistant

## 2020-10-31 ENCOUNTER — Other Ambulatory Visit: Payer: Self-pay

## 2020-10-31 VITALS — BP 149/100 | HR 66 | Temp 97.7°F | Wt 198.4 lb

## 2020-10-31 DIAGNOSIS — I10 Essential (primary) hypertension: Secondary | ICD-10-CM | POA: Diagnosis not present

## 2020-10-31 DIAGNOSIS — H6123 Impacted cerumen, bilateral: Secondary | ICD-10-CM | POA: Diagnosis not present

## 2020-10-31 NOTE — Progress Notes (Signed)
Established patient visit   Patient: Edward Blake   DOB: 1986-04-09   35 y.o. Male  MRN: [id number] Visit Date: 10/31/2020  Today's healthcare provider: Trey Sailors, PA-C   Chief Complaint  Patient presents with  . Ear Fullness  I,Vickie Ponds M Pecolia Marando,acting as a scribe for Trey Sailors, PA-C.,have documented all relevant documentation on the behalf of Trey Sailors, PA-C,as directed by  Trey Sailors, PA-C while in the presence of Trey Sailors, PA-C.  Subjective    Ear Fullness  There is pain in the right ear. This is a new problem. The current episode started in the past 7 days. The problem occurs constantly. The problem has been unchanged. There has been no fever. The pain is at a severity of 0/10. The patient is experiencing no pain. Pertinent negatives include no ear discharge, headaches, hearing loss, rhinorrhea, sore throat or vomiting. He has tried nothing for the symptoms. The treatment provided no relief.    Tobacco Abuse: Currently smoking one pack per week.   HTN: Reports father with high blood pressure.  BP Readings from Last 5 Encounters:  10/31/20 (!) 149/100  09/30/20 (!) 167/103  02/08/20 138/80  01/15/20 126/85  03/06/19 130/72        Medications: Outpatient Medications Prior to Visit  Medication Sig  . clonazePAM (KLONOPIN) 1 MG tablet TAKE ONE TABLET BY MOUTH TWICE A DAY AS NEEDED  . sertraline (ZOLOFT) 100 MG tablet Take 1 tablet (100 mg total) by mouth daily.   No facility-administered medications prior to visit.    Review of Systems  HENT: Negative for ear discharge, hearing loss, rhinorrhea and sore throat.   Gastrointestinal: Negative for vomiting.  Neurological: Negative for headaches.       Objective    BP (!) 149/100 (BP Location: Right Arm, Patient Position: Sitting, Cuff Size: Normal)   Pulse 66   Temp 97.7 F (36.5 C) (Oral)   Wt 198 lb 6.4 oz (90 kg)   SpO2 100%   BMI 24.80 kg/m     Physical  Exam Constitutional:      Appearance: Normal appearance. He is normal weight.  HENT:     Right Ear: There is impacted cerumen.     Left Ear: There is impacted cerumen.     Ears:     Comments: Bilateral cerumen impaction that resolves with lavage.  Skin:    General: Skin is warm and dry.  Neurological:     General: No focal deficit present.     Mental Status: He is alert and oriented to person, place, and time.  Psychiatric:        Mood and Affect: Mood normal.        Behavior: Behavior normal.       No results found for any visits on 10/31/20.  Assessment & Plan    1. Primary hypertension  Counseled on hypertensive readings, offered to start medication today. He is seeing his PCP 11/17/2020 and would like to discuss it then.   2. Bilateral impacted cerumen  Resolved with lavage.     Return if symptoms worsen or fail to improve.      ITrey Sailors, PA-C, have reviewed all documentation for this visit. The documentation on 10/31/20 for the exam, diagnosis, procedures, and orders are all accurate and complete.  The entirety of the information documented in the History of Present Illness, Review of Systems and Physical Exam were personally obtained by  me. Portions of this information were initially documented by [facility] and reviewed by me for thoroughness and accuracy.   I spent 20 minutes dedicated to the care of this patient on the date of this encounter to include pre-visit review of records, face-to-face time with the patient discussing cerumen impaction and HTN, and post visit ordering of testing.    Maryella Shivers  [facility] [phone] (phone) [phone] (fax)  [facility]

## 2020-11-17 ENCOUNTER — Ambulatory Visit: Payer: Managed Care, Other (non HMO) | Admitting: Family Medicine

## 2020-11-26 ENCOUNTER — Ambulatory Visit: Payer: Managed Care, Other (non HMO) | Admitting: Family Medicine

## 2020-11-26 NOTE — Progress Notes (Deleted)
      Established patient visit   Patient: Edward Blake   DOB: 1986/06/26   35 y.o. Male  MRN: [id number] Visit Date: 11/26/2020  Today's healthcare provider: Mila Merry, MD   No chief complaint on file.  Subjective    HPI  Decreased hearing: Patient reports having a decrease in hearing in his ear. He was recently seen in the office for bilateral impacted cerumen on 10/31/2020 which resolved with lavage.     Hypertension, follow-up  BP Readings from Last 3 Encounters:  10/31/20 (!) 149/100  09/30/20 (!) 167/103  02/08/20 138/80   Wt Readings from Last 3 Encounters:  10/31/20 198 lb 6.4 oz (90 kg)  09/30/20 193 lb 12.8 oz (87.9 kg)  02/08/20 198 lb (89.8 kg)     He was last seen for hypertension 1 months ago (seen by Osvaldo Angst, PA-C).  BP at that visit was 149/100. Management since that visit includes offering to start medication. Patient declined medication at that time.  He reports {excellent/good/fair/poor:19665} compliance with treatment. He {is/is not:9024} having side effects. {document side effects if present:1} He is following a {diet:21022986} diet. He {is/is not:9024} exercising. He {does/does not:200015} smoke.  Use of agents associated with hypertension: none.   Outside blood pressures are {***enter patient reported home BP readings, or 'not being checked':1}. Symptoms: {Yes/No:20286} chest pain {Yes/No:20286} chest pressure  {Yes/No:20286} palpitations {Yes/No:20286} syncope  {Yes/No:20286} dyspnea {Yes/No:20286} orthopnea  {Yes/No:20286} paroxysmal nocturnal dyspnea {Yes/No:20286} lower extremity edema   Pertinent labs: No results found for: CHOL, HDL, LDLCALC, LDLDIRECT, TRIG, CHOLHDL Lab Results  Component Value Date   NA 138 06/29/2017   K 3.8 06/29/2017   CREATININE 0.92 06/29/2017   GFRNONAA 111 06/29/2017   GFRAA 129 06/29/2017   GLUCOSE 78 06/29/2017     The ASCVD Risk score (Goff DC Jr., et al., 2013) failed to calculate for the  following reasons:   The 2013 ASCVD risk score is only valid for ages 57 to 60   ---------------------------------------------------------------------------------------------------  {Show patient history (optional):23778::" "}   Medications: Outpatient Medications Prior to Visit  Medication Sig  . clonazePAM (KLONOPIN) 1 MG tablet TAKE ONE TABLET BY MOUTH TWICE A DAY AS NEEDED  . sertraline (ZOLOFT) 100 MG tablet Take 1 tablet (100 mg total) by mouth daily.   No facility-administered medications prior to visit.    Review of Systems  Constitutional: Negative for appetite change, chills and fever.  Respiratory: Negative for chest tightness, shortness of breath and wheezing.   Cardiovascular: Negative for chest pain and palpitations.  Gastrointestinal: Negative for abdominal pain, nausea and vomiting.    {Labs  Heme  Chem  Endocrine  Serology  Results Review (optional):23779::" "}   Objective    There were no vitals taken for this visit. {Show previous vital signs (optional):23777::" "}   Physical Exam  ***  No results found for any visits on 11/26/20.  Assessment & Plan     ***  No follow-ups on file.      {provider attestation***:1}   Mila Merry, MD  [facility] [phone] (phone) [phone] (fax)  [facility]

## 2021-01-06 ENCOUNTER — Encounter: Payer: Self-pay | Admitting: Family Medicine

## 2021-01-06 ENCOUNTER — Ambulatory Visit (INDEPENDENT_AMBULATORY_CARE_PROVIDER_SITE_OTHER): Payer: Managed Care, Other (non HMO) | Admitting: Family Medicine

## 2021-01-06 ENCOUNTER — Other Ambulatory Visit: Payer: Self-pay

## 2021-01-06 VITALS — BP 166/109 | HR 75 | Temp 98.2°F | Wt 202.8 lb

## 2021-01-06 DIAGNOSIS — F418 Other specified anxiety disorders: Secondary | ICD-10-CM

## 2021-01-06 DIAGNOSIS — R030 Elevated blood-pressure reading, without diagnosis of hypertension: Secondary | ICD-10-CM

## 2021-01-06 DIAGNOSIS — F331 Major depressive disorder, recurrent, moderate: Secondary | ICD-10-CM

## 2021-01-06 DIAGNOSIS — Z111 Encounter for screening for respiratory tuberculosis: Secondary | ICD-10-CM | POA: Diagnosis not present

## 2021-01-06 DIAGNOSIS — Z0184 Encounter for antibody response examination: Secondary | ICD-10-CM

## 2021-01-06 NOTE — Progress Notes (Signed)
      Established patient visit   Patient: Edward Blake   DOB: [birth year]   35 y.o. Male  MRN: [id number] Visit Date: 01/06/2021  Today's healthcare provider: Mila Merry, MD   Chief Complaint  Patient presents with  . Vaccinations Update   I,Porsha C McClurkin,acting as a Neurosurgeon for Mila Merry, MD.,have documented all relevant documentation on the behalf of Mila Merry, MD,as directed by  Mila Merry, MD while in the presence of Mila Merry, MD.  Subjective    HPI  Vaccination update: Patient is here today for vaccinations. 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 Patient feels well today.      Medications: Outpatient Medications Prior to Visit  Medication Sig  . clonazePAM (KLONOPIN) 1 MG tablet TAKE ONE TABLET BY MOUTH TWICE A DAY AS NEEDED  . sertraline (ZOLOFT) 100 MG tablet Take 1 tablet (100 mg total) by mouth daily.   No facility-administered medications prior to visit.    Review of Systems  Constitutional: Negative.   Cardiovascular: Negative.   Hematological: Negative.        Objective    BP (!) 166/109 (BP Location: Left Arm, Patient Position: Sitting, Cuff Size: Normal)   Pulse 75   Temp 98.2 F (36.8 C) (Oral)   Wt 202 lb 12.8 oz (92 kg)   SpO2 99%   BMI 25.35 kg/m     Physical Exam  General appearance:  Well developed, well nourished male, cooperative and in no acute distress Head: Normocephalic, without obvious abnormality, atraumatic Respiratory: Respirations even and unlabored, normal respiratory rate Extremities: All extremities are intact.  Skin: Skin color, texture, turgor normal. No rashes seen  Psych: Appropriate mood and affect. Neurologic: Mental status: Alert, oriented to person, place, and time, thought content appropriate.     Assessment & Plan     1. Elevated blood pressure reading DASH diet information.  - Comprehensive metabolic panel -  TSH - Direct LDL  2. Screening-pulmonary TB - QuantiFERON-TB Gold Plus  3. Immunity status testing  - Varicella Zoster Abs, IgG/IgM - Measles/Mumps/Rubella Immunity  4. Other specified anxiety disorders Doing very well on current dose of sertraline and clonazepam.   5. Moderate episode of recurrent major depressive disorder (HCC) Continue current medications.     No follow-ups on file.         Mila Merry, MD  [facility] [phone] (phone) [phone] (fax)  [facility]

## 2021-01-06 NOTE — Patient Instructions (Signed)
. Please review the attached list of medications and notify my office if there are any errors.   . Please bring all of your medications to every appointment so we can make sure that our medication list is the same as yours.    https://www.nhlbi.nih.gov/files/docs/public/heart/dash_brief.pdf">    DASH Eating Plan DASH stands for Dietary Approaches to Stop Hypertension. The DASH eating plan is a healthy eating plan that has been shown to:  Reduce high blood pressure (hypertension).  Reduce your risk for type 2 diabetes, heart disease, and stroke.  Help with weight loss. What are tips for following this plan? Reading food labels  Check food labels for the amount of salt (sodium) per serving. Choose foods with less than 5 percent of the Daily Value of sodium. Generally, foods with less than 300 milligrams (mg) of sodium per serving fit into this eating plan.  To find whole grains, look for the word "whole" as the first word in the ingredient list. Shopping  Buy products labeled as "low-sodium" or "no salt added."  Buy fresh foods. Avoid canned foods and pre-made or frozen meals. Cooking  Avoid adding salt when cooking. Use salt-free seasonings or herbs instead of table salt or sea salt. Check with your health care provider or pharmacist before using salt substitutes.  Do not fry foods. Cook foods using healthy methods such as baking, boiling, grilling, roasting, and broiling instead.  Cook with heart-healthy oils, such as olive, canola, avocado, soybean, or sunflower oil. Meal planning  Eat a balanced diet that includes: ? 4 or more servings of fruits and 4 or more servings of vegetables each day. Try to fill one-half of your plate with fruits and vegetables. ? 6-8 servings of whole grains each day. ? Less than 6 oz (170 g) of lean meat, poultry, or fish each day. A 3-oz (85-g) serving of meat is about the same size as a deck of cards. One egg equals 1 oz (28 g). ? 2-3 servings of  low-fat dairy each day. One serving is 1 cup (237 mL). ? 1 serving of nuts, seeds, or beans 5 times each week. ? 2-3 servings of heart-healthy fats. Healthy fats called omega-3 fatty acids are found in foods such as walnuts, flaxseeds, fortified milks, and eggs. These fats are also found in cold-water fish, such as sardines, salmon, and mackerel.  Limit how much you eat of: ? Canned or prepackaged foods. ? Food that is high in trans fat, such as some fried foods. ? Food that is high in saturated fat, such as fatty meat. ? Desserts and other sweets, sugary drinks, and other foods with added sugar. ? Full-fat dairy products.  Do not salt foods before eating.  Do not eat more than 4 egg yolks a week.  Try to eat at least 2 vegetarian meals a week.  Eat more home-cooked food and less restaurant, buffet, and fast food.   Lifestyle  When eating at a restaurant, ask that your food be prepared with less salt or no salt, if possible.  If you drink alcohol: ? Limit how much you use to:  0-1 drink a day for women who are not pregnant.  0-2 drinks a day for men. ? Be aware of how much alcohol is in your drink. In the [place] one drink equals one 12 oz bottle of beer (355 mL), one 5 oz glass of wine (148 mL), or one 1 oz glass of hard liquor (44 mL). General information  Avoid eating more   than 2,300 mg of salt a day. If you have hypertension, you may need to reduce your sodium intake to 1,500 mg a day.  Work with your health care provider to maintain a healthy body weight or to lose weight. Ask what an ideal weight is for you.  Get at least 30 minutes of exercise that causes your heart to beat faster (aerobic exercise) most days of the week. Activities may include walking, swimming, or biking.  Work with your health care provider or dietitian to adjust your eating plan to your individual calorie needs. What foods should I eat? Fruits All fresh, dried, or frozen fruit. Canned fruit in  natural juice (without added sugar). Vegetables Fresh or frozen vegetables (raw, steamed, roasted, or grilled). Low-sodium or reduced-sodium tomato and vegetable juice. Low-sodium or reduced-sodium tomato sauce and tomato paste. Low-sodium or reduced-sodium canned vegetables. Grains Whole-grain or whole-wheat bread. Whole-grain or whole-wheat pasta. Brown rice. Oatmeal. Quinoa. Bulgur. Whole-grain and low-sodium cereals. Pita bread. Low-fat, low-sodium crackers. Whole-wheat flour tortillas. Meats and other proteins Skinless chicken or turkey. Ground chicken or turkey. Pork with fat trimmed off. Fish and seafood. Egg whites. Dried beans, peas, or lentils. Unsalted nuts, nut butters, and seeds. Unsalted canned beans. Lean cuts of beef with fat trimmed off. Low-sodium, lean precooked or cured meat, such as sausages or meat loaves. Dairy Low-fat (1%) or fat-free (skim) milk. Reduced-fat, low-fat, or fat-free cheeses. Nonfat, low-sodium ricotta or cottage cheese. Low-fat or nonfat yogurt. Low-fat, low-sodium cheese. Fats and oils Soft margarine without trans fats. Vegetable oil. Reduced-fat, low-fat, or light mayonnaise and salad dressings (reduced-sodium). Canola, safflower, olive, avocado, soybean, and sunflower oils. Avocado. Seasonings and condiments Herbs. Spices. Seasoning mixes without salt. Other foods Unsalted popcorn and pretzels. Fat-free sweets. The items listed above may not be a complete list of foods and beverages you can eat. Contact a dietitian for more information. What foods should I avoid? Fruits Canned fruit in a light or heavy syrup. Fried fruit. Fruit in cream or butter sauce. Vegetables Creamed or fried vegetables. Vegetables in a cheese sauce. Regular canned vegetables (not low-sodium or reduced-sodium). Regular canned tomato sauce and paste (not low-sodium or reduced-sodium). Regular tomato and vegetable juice (not low-sodium or reduced-sodium). Pickles.  Olives. Grains Baked goods made with fat, such as croissants, muffins, or some breads. Dry pasta or rice meal packs. Meats and other proteins Fatty cuts of meat. Ribs. Fried meat. Bacon. Bologna, salami, and other precooked or cured meats, such as sausages or meat loaves. Fat from the back of a pig (fatback). Bratwurst. Salted nuts and seeds. Canned beans with added salt. Canned or smoked fish. Whole eggs or egg yolks. Chicken or turkey with skin. Dairy Whole or 2% milk, cream, and half-and-half. Whole or full-fat cream cheese. Whole-fat or sweetened yogurt. Full-fat cheese. Nondairy creamers. Whipped toppings. Processed cheese and cheese spreads. Fats and oils Butter. Stick margarine. Lard. Shortening. Ghee. Bacon fat. Tropical oils, such as coconut, palm kernel, or palm oil. Seasonings and condiments Onion salt, garlic salt, seasoned salt, table salt, and sea salt. Worcestershire sauce. Tartar sauce. Barbecue sauce. Teriyaki sauce. Soy sauce, including reduced-sodium. Steak sauce. Canned and packaged gravies. Fish sauce. Oyster sauce. Cocktail sauce. Store-bought horseradish. Ketchup. Mustard. Meat flavorings and tenderizers. Bouillon cubes. Hot sauces. Pre-made or packaged marinades. Pre-made or packaged taco seasonings. Relishes. Regular salad dressings. Other foods Salted popcorn and pretzels. The items listed above may not be a complete list of foods and beverages you should avoid. Contact a dietitian for   more information. Where to find more information  National Heart, Lung, and Blood Institute: www.nhlbi.nih.gov  American Heart Association: www.heart.org  Academy of Nutrition and Dietetics: www.eatright.org  National Kidney Foundation: www.kidney.org Summary  The DASH eating plan is a healthy eating plan that has been shown to reduce high blood pressure (hypertension). It may also reduce your risk for type 2 diabetes, heart disease, and stroke.  When on the DASH eating plan, aim to  eat more fresh fruits and vegetables, whole grains, lean proteins, low-fat dairy, and heart-healthy fats.  With the DASH eating plan, you should limit salt (sodium) intake to 2,300 mg a day. If you have hypertension, you may need to reduce your sodium intake to 1,500 mg a day.  Work with your health care provider or dietitian to adjust your eating plan to your individual calorie needs. This information is not intended to replace advice given to you by your health care provider. Make sure you discuss any questions you have with your health care provider. Document Revised: 07/27/2019 Document Reviewed: 07/27/2019 Elsevier Patient Education  2021 Elsevier Inc.  

## 2021-01-09 ENCOUNTER — Encounter: Payer: Self-pay | Admitting: Family Medicine

## 2021-01-09 LAB — VARICELLA ZOSTER ABS, IGG/IGM
Varicella IgM: <0.91 index (ref 0.00–0.90)
Varicella zoster IgG: 3008 index (ref >165)

## 2021-01-09 LAB — COMPREHENSIVE METABOLIC PANEL
ALT: 52 IU/L — ABNORMAL HIGH (ref 0–44)
AST: 56 IU/L — ABNORMAL HIGH (ref 0–40)
Albumin/Globulin Ratio: 2 (ref 1.2–2.2)
Albumin: 5 g/dL (ref 4.0–5.0)
Alkaline Phosphatase: 81 IU/L (ref 44–121)
BUN/Creatinine Ratio: 14 (ref 9–20)
BUN: 13 mg/dL (ref 6–20)
Bilirubin Total: 0.5 mg/dL (ref 0.0–1.2)
CO2: 25 mmol/L (ref 20–29)
Calcium: 10.2 mg/dL (ref 8.7–10.2)
Chloride: 99 mmol/L (ref 96–106)
Creatinine, Ser: 0.91 mg/dL (ref 0.76–1.27)
Globulin, Total: 2.5 g/dL (ref 1.5–4.5)
Glucose: 93 mg/dL (ref 65–99)
Potassium: 4.3 mmol/L (ref 3.5–5.2)
Sodium: 142 mmol/L (ref 134–144)
Total Protein: 7.5 g/dL (ref 6.0–8.5)
eGFR: 114 mL/min/{1.73_m2} (ref >59)

## 2021-01-09 LAB — MEASLES/MUMPS/RUBELLA IMMUNITY
MUMPS ABS, IGG: 27.4 AU/mL (ref >10.9)
RUBEOLA AB, IGG: <13.5 AU/mL — ABNORMAL LOW (ref >16.4)
Rubella Antibodies, IGG: <0.9 index — ABNORMAL LOW (ref >0.99)

## 2021-01-09 LAB — QUANTIFERON-TB GOLD PLUS
QuantiFERON Mitogen Value: >10 IU/mL
QuantiFERON Nil Value: 0.18 IU/mL
QuantiFERON TB1 Ag Value: 0.41 IU/mL
QuantiFERON TB2 Ag Value: 0.26 IU/mL
QuantiFERON-TB Gold Plus: NEGATIVE

## 2021-01-09 LAB — LDL CHOLESTEROL, DIRECT: LDL Direct: 169 mg/dL — ABNORMAL HIGH (ref 0–99)

## 2021-01-09 LAB — TSH: TSH: 1.45 u[IU]/mL (ref 0.450–4.500)

## 2021-02-03 ENCOUNTER — Ambulatory Visit (LOCAL_COMMUNITY_HEALTH_CENTER): Payer: Managed Care, Other (non HMO)

## 2021-02-03 ENCOUNTER — Other Ambulatory Visit: Payer: Self-pay

## 2021-02-03 DIAGNOSIS — Z7185 Encounter for immunization safety counseling: Secondary | ICD-10-CM

## 2021-02-03 DIAGNOSIS — Z111 Encounter for screening for respiratory tuberculosis: Secondary | ICD-10-CM

## 2021-02-03 NOTE — Progress Notes (Signed)
Patient presents to nurse clinic for MMR vaccine and TB skin test. 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 Pt also needing MMR vaccine since he had an abnormal MMR Titer. Counseled patient on MMR vaccine. After discussion pt will wait to get this vaccine at his 2nd TB skin test appt.

## 2021-02-06 ENCOUNTER — Ambulatory Visit (LOCAL_COMMUNITY_HEALTH_CENTER): Payer: Managed Care, Other (non HMO)

## 2021-02-06 DIAGNOSIS — Z111 Encounter for screening for respiratory tuberculosis: Secondary | ICD-10-CM

## 2021-02-06 LAB — TB SKIN TEST
Induration: 0 mm
TB Skin Test: NEGATIVE

## 2021-02-10 ENCOUNTER — Ambulatory Visit (LOCAL_COMMUNITY_HEALTH_CENTER): Payer: Managed Care, Other (non HMO)

## 2021-02-10 ENCOUNTER — Other Ambulatory Visit: Payer: Managed Care, Other (non HMO)

## 2021-02-10 ENCOUNTER — Other Ambulatory Visit: Payer: Self-pay

## 2021-02-10 DIAGNOSIS — Z23 Encounter for immunization: Secondary | ICD-10-CM

## 2021-02-10 NOTE — Progress Notes (Signed)
In Nurse Clinic for MMR. Low MMR titer 01/06/2021. MMR given today and tolerated well. [facility] updated and copy given and explained. Josie Saunders, RN

## 2021-02-16 ENCOUNTER — Encounter: Payer: Self-pay | Admitting: Family Medicine

## 2021-02-16 DIAGNOSIS — I10 Essential (primary) hypertension: Secondary | ICD-10-CM

## 2021-02-17 MED ORDER — METOPROLOL SUCCINATE ER 25 MG PO TB24: 25.0000 mg | ORAL_TABLET | Freq: Every day | ORAL | 0 refills | Status: DC

## 2021-03-16 ENCOUNTER — Other Ambulatory Visit: Payer: Self-pay | Admitting: Family Medicine

## 2021-03-16 DIAGNOSIS — I10 Essential (primary) hypertension: Secondary | ICD-10-CM

## 2021-03-16 DIAGNOSIS — F418 Other specified anxiety disorders: Secondary | ICD-10-CM

## 2021-03-26 ENCOUNTER — Other Ambulatory Visit: Payer: Self-pay | Admitting: Family Medicine

## 2021-03-26 ENCOUNTER — Encounter: Payer: Self-pay | Admitting: Family Medicine

## 2021-03-26 DIAGNOSIS — R7401 Elevation of levels of liver transaminase levels: Secondary | ICD-10-CM

## 2021-04-12 ENCOUNTER — Other Ambulatory Visit: Payer: Self-pay | Admitting: Family Medicine

## 2021-04-12 DIAGNOSIS — I10 Essential (primary) hypertension: Secondary | ICD-10-CM

## 2021-05-12 ENCOUNTER — Telehealth: Payer: Self-pay | Admitting: Family Medicine

## 2021-05-12 DIAGNOSIS — I10 Essential (primary) hypertension: Secondary | ICD-10-CM

## 2021-05-12 MED ORDER — METOPROLOL SUCCINATE ER 25 MG PO TB24: 25.0000 mg | ORAL_TABLET | Freq: Every day | ORAL | 0 refills | Status: DC

## 2021-05-12 NOTE — Telephone Encounter (Signed)
[facility] faxed refill request for the following medications: ° °metoprolol succinate (TOPROL-XL) 25 MG 24 hr tablet  ° ° °Please advise. °

## 2021-05-16 IMAGING — US ULTRASOUND SCROTUM DOPPLER COMPLETE
1 series · 13 of 25 positions shown · non-contrast
Comparison: None.

CLINICAL DATA: Fullness left scrotum with tenderness

EXAM:
SCROTAL ULTRASOUND
DOPPLER ULTRASOUND OF THE TESTICLES
TECHNIQUE: Complete ultrasound examination of the testicles, epididymis, and
other scrotal structures was performed. Color and spectral Doppler
ultrasound were also utilized to evaluate blood flow to the
testicles.

[Series 1: ultrasound scrotum doppler complete · 13 of 61 slices shown]
[im 1/61]
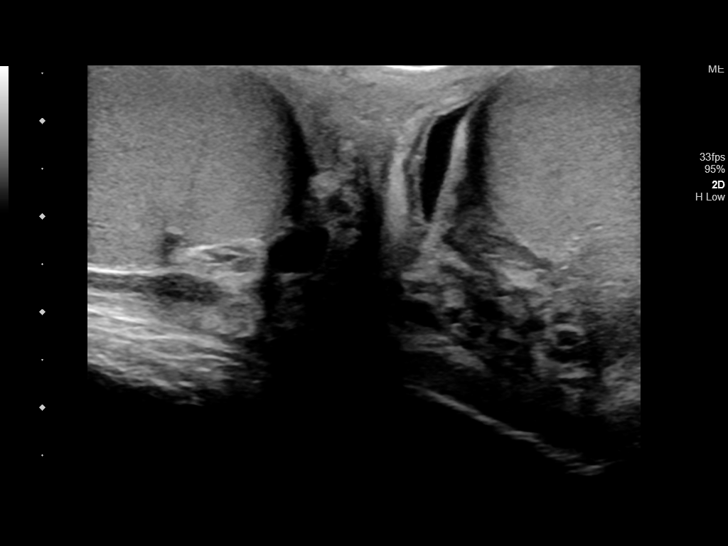
[im 6/61]
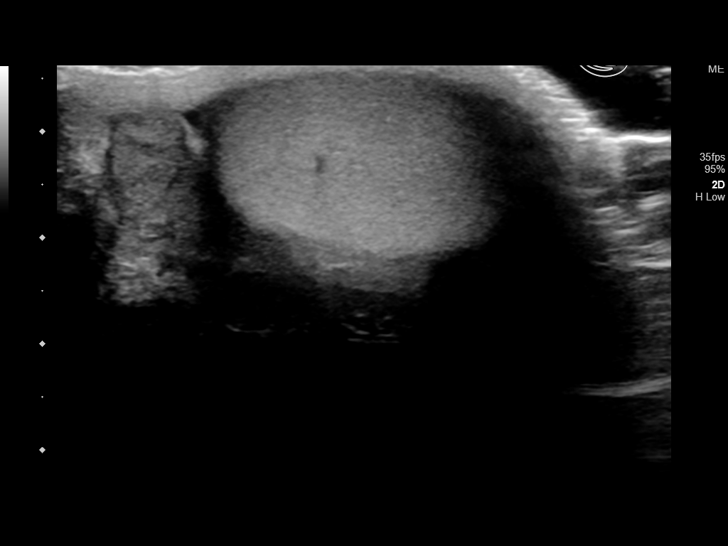
[im 11/61]
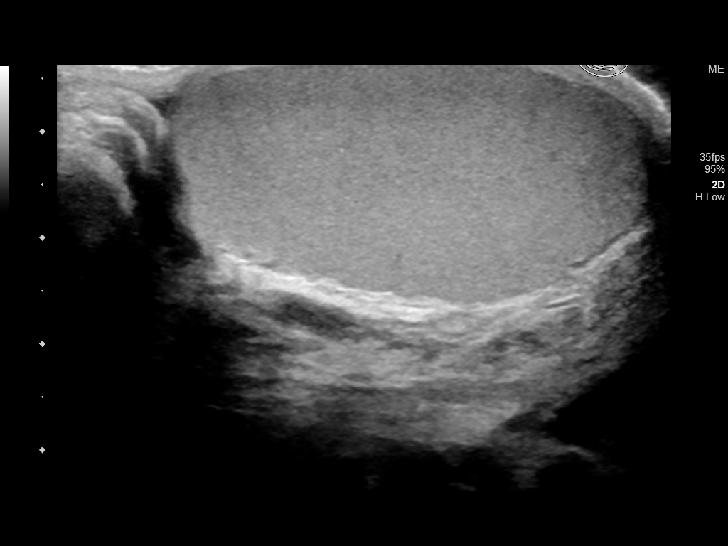
[im 16/61]
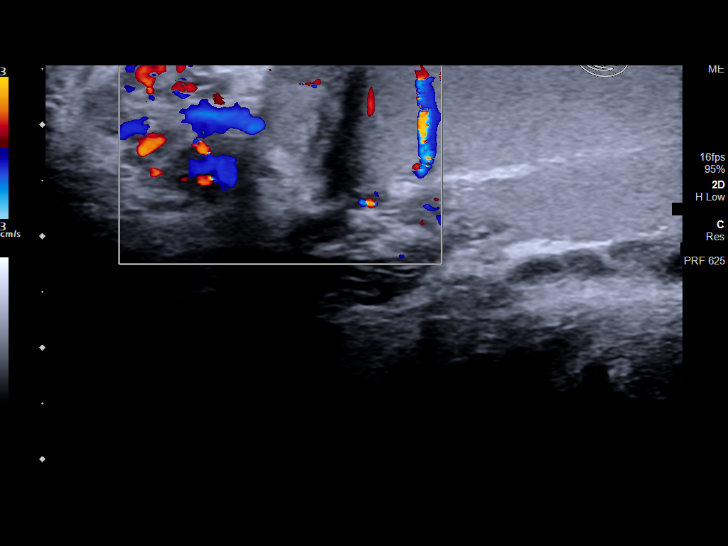
[im 21/61]
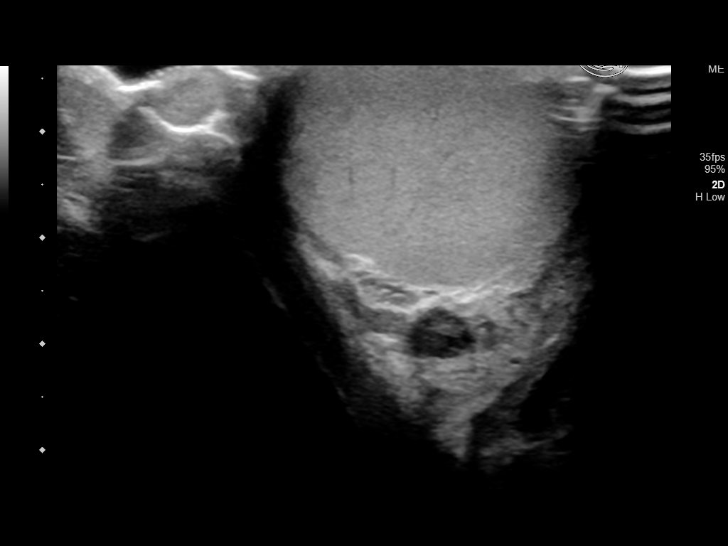
[im 26/61]
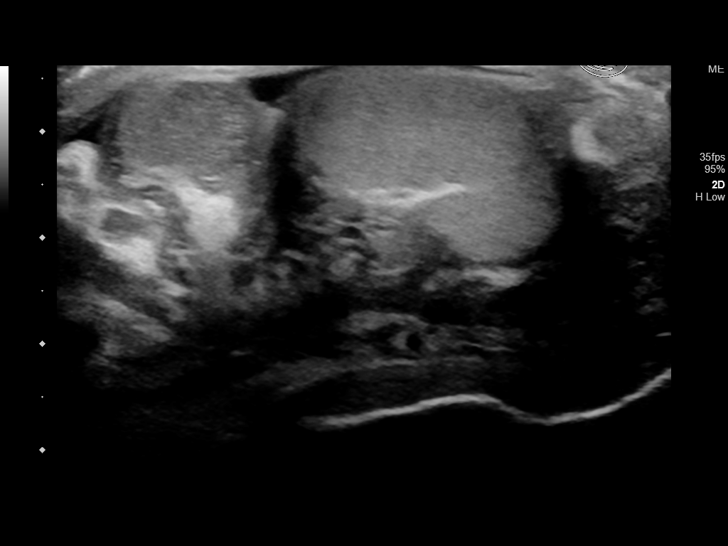
[im 31/61]
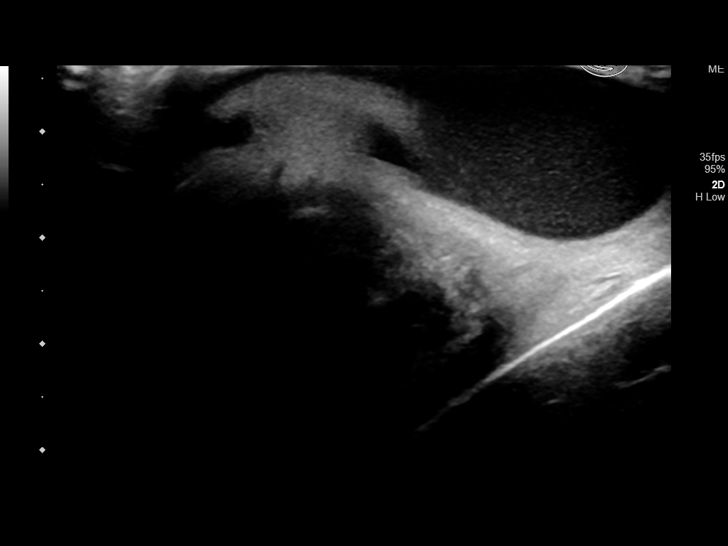
[im 36/61]
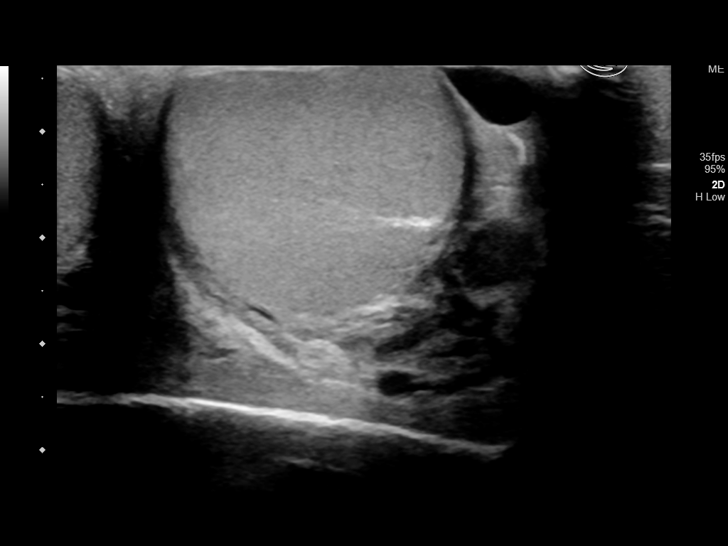
[im 41/61]
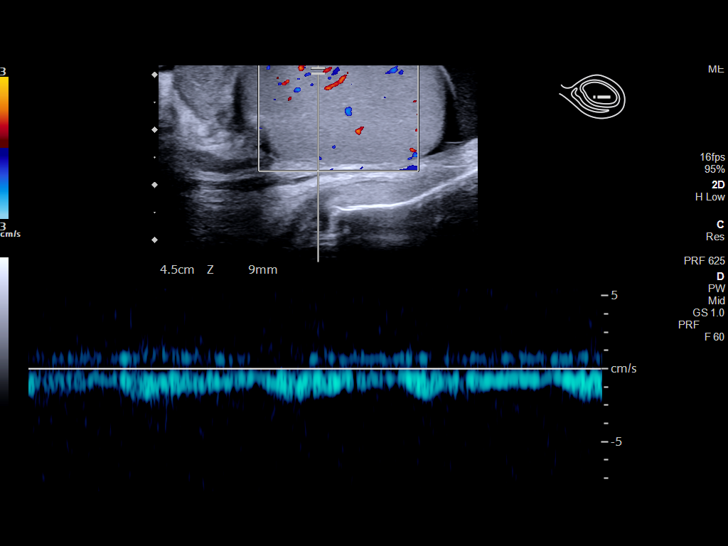
[im 46/61]
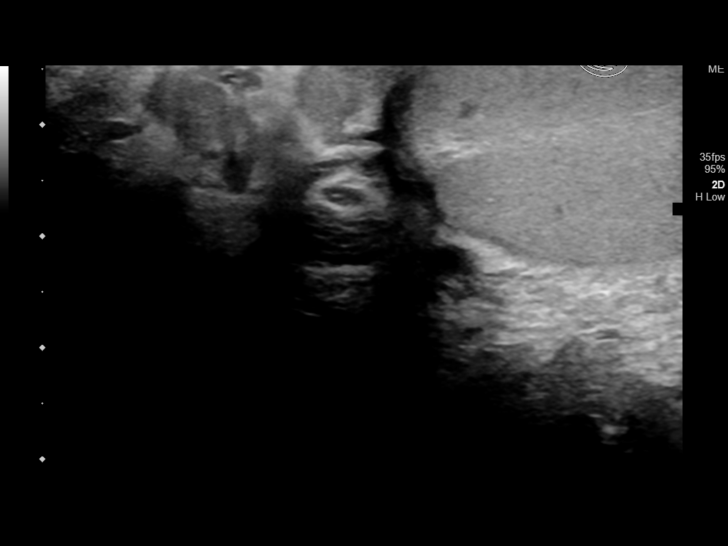
[im 51/61]
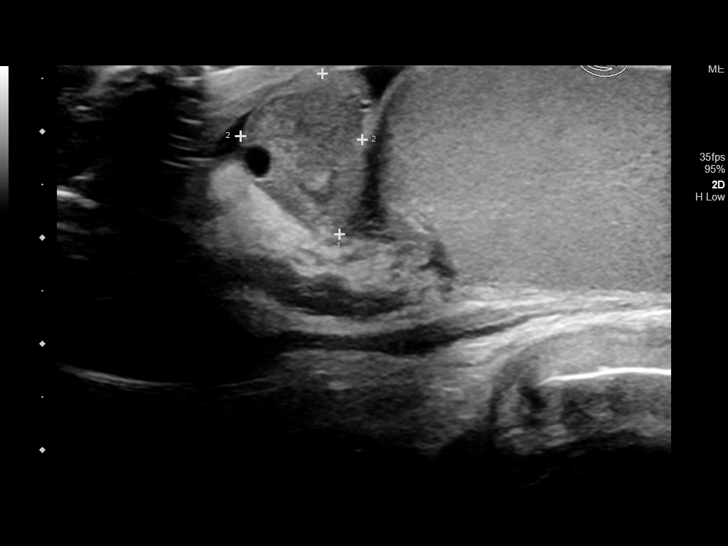
[im 56/61]
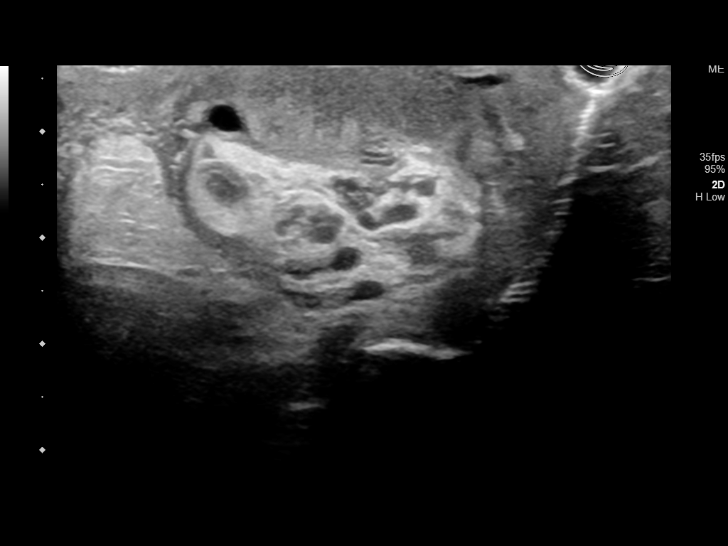
[im 61/61]
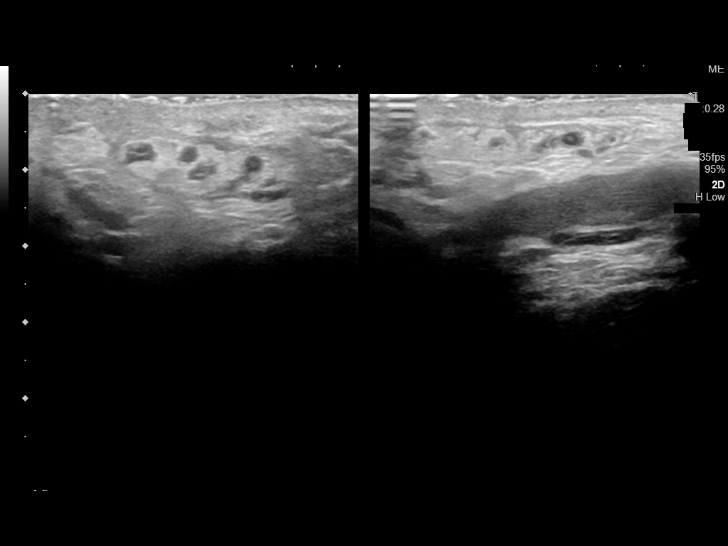

[13 of 25 positions shown; findings below may reference images not displayed]

FINDINGS: Right testicle

Measurements: 4.3 x 2.5 x 3.2 cm. No mass or microlithiasis
visualized.

Left testicle

Measurements: 4.5 x 2.5 x 3.2 cm. No mass or microlithiasis
visualized.

Right epididymis:  Normal in size and appearance.

Left epididymis: The left epididymis is diffusely enlarged and
hyperemic. There is a 3 mm cyst in the head of the epididymis on the
left.

Hydrocele: There is a moderate hydrocele on the left. There is no
appreciable hydrocele on the right.

Varicocele:  None visualized.

Pulsed Doppler interrogation of both testes demonstrates normal low
resistance arterial and venous waveforms bilaterally.

Fat protrudes into the left inguinal canal. No bowel is seen in this
area.
IMPRESSION: 1. Findings indicative of epididymitis on the left. 3 mm epididymal
head cyst noted on the left. Moderate hydrocele on the left.

2. No intratesticular mass on either side. No orchitis or torsion
evident on either side.

3.  Fat protrudes into the left inguinal ring.

## 2021-05-19 ENCOUNTER — Other Ambulatory Visit: Payer: Self-pay | Admitting: Family Medicine

## 2021-05-19 DIAGNOSIS — F410 Panic disorder [episodic paroxysmal anxiety] without agoraphobia: Secondary | ICD-10-CM

## 2021-05-27 ENCOUNTER — Encounter: Payer: Self-pay | Admitting: General Surgery

## 2021-06-08 ENCOUNTER — Other Ambulatory Visit: Payer: Self-pay | Admitting: Family Medicine

## 2021-06-08 DIAGNOSIS — I10 Essential (primary) hypertension: Secondary | ICD-10-CM

## 2021-06-10 ENCOUNTER — Other Ambulatory Visit: Payer: Self-pay | Admitting: Family Medicine

## 2021-06-10 DIAGNOSIS — F418 Other specified anxiety disorders: Secondary | ICD-10-CM

## 2021-07-13 ENCOUNTER — Ambulatory Visit: Payer: Managed Care, Other (non HMO) | Admitting: Family Medicine

## 2021-07-13 NOTE — Progress Notes (Deleted)
      Established patient visit   Patient: Edward Blake   DOB: 1986-07-17   35 y.o. Male  MRN: [id number] Visit Date: 07/13/2021  Today's healthcare provider: Lelon Huh, MD   No chief complaint on file.  Subjective    HPI  Hypertension, follow-up  BP Readings from Last 3 Encounters:  01/06/21 (!) 166/109  10/31/20 (!) 149/100  09/30/20 (!) 167/103   Wt Readings from Last 3 Encounters:  01/06/21 202 lb 12.8 oz (92 kg)  10/31/20 198 lb 6.4 oz (90 kg)  09/30/20 193 lb 12.8 oz (87.9 kg)     He was last seen for hypertension 6 months ago.  BP at that visit was 166/109. Management since that visit includes no changes in medications but work lifestyle changes.  He reports {excellent/good/fair/poor:19665} compliance with treatment. He {is/is not:9024} having side effects. {document side effects if present:1} He is following a {diet:21022986} diet. He {is/is not:9024} exercising. He {does/does not:200015} smoke.  Use of agents associated with hypertension: none.   Outside blood pressures are {***enter patient reported home BP readings, or 'not being checked':1}. Symptoms: {Yes/No:20286} chest pain {Yes/No:20286} chest pressure  {Yes/No:20286} palpitations {Yes/No:20286} syncope  {Yes/No:20286} dyspnea {Yes/No:20286} orthopnea  {Yes/No:20286} paroxysmal nocturnal dyspnea {Yes/No:20286} lower extremity edema   Pertinent labs: Lab Results  Component Value Date   LDLDIRECT 169 (H) 01/06/2021   Lab Results  Component Value Date   NA 142 01/06/2021   K 4.3 01/06/2021   CREATININE 0.91 01/06/2021   EGFR 114 01/06/2021   GLUCOSE 93 01/06/2021   TSH 1.450 01/06/2021     The ASCVD Risk score (Arnett DK, et al., 2019) failed to calculate for the following reasons:   The 2019 ASCVD risk score is only valid for ages 43 to 40   ---------------------------------------------------------------------------------------------------   {Link to patient history deactivated due to  formatting error:1}  Medications: Outpatient Medications Prior to Visit  Medication Sig   metoprolol succinate (TOPROL-XL) 25 MG 24 hr tablet TAKE ONE TABLET BY MOUTH DAILY   clonazePAM (KLONOPIN) 1 MG tablet TAKE ONE TABLET BY MOUTH TWICE A DAY AS NEEDED   sertraline (ZOLOFT) 100 MG tablet TAKE 1 TABLET BY MOUTH ONCE DAILY   No facility-administered medications prior to visit.    Review of Systems  {Labs  Heme  Chem  Endocrine  Serology  Results Review (optional):23779}   Objective    There were no vitals taken for this visit. {Show previous vital signs (optional):23777}  Physical Exam  ***  No results found for any visits on 07/13/21.  Assessment & Plan     ***  No follow-ups on file.      {provider attestation***:1}   Lelon Huh, MD  [facility] [phone] (phone) [phone] (fax)  [place]

## 2021-07-20 ENCOUNTER — Other Ambulatory Visit: Payer: Self-pay | Admitting: Family Medicine

## 2021-07-20 DIAGNOSIS — F410 Panic disorder [episodic paroxysmal anxiety] without agoraphobia: Secondary | ICD-10-CM

## 2021-09-10 ENCOUNTER — Other Ambulatory Visit: Payer: Self-pay | Admitting: Family Medicine

## 2021-09-10 DIAGNOSIS — F418 Other specified anxiety disorders: Secondary | ICD-10-CM

## 2021-10-08 ENCOUNTER — Other Ambulatory Visit: Payer: Self-pay | Admitting: Family Medicine

## 2021-10-08 DIAGNOSIS — F418 Other specified anxiety disorders: Secondary | ICD-10-CM

## 2021-10-08 NOTE — Telephone Encounter (Signed)
Requested medications are due for refill today.  yes  Requested medications are on the active medications list.  yes  Last refill. 09/10/2021  Future visit scheduled.   no  Notes to clinic.  Not delegated.     Requested Prescriptions  Pending Prescriptions Disp Refills   clonazePAM (KLONOPIN) 1 MG tablet [Pharmacy Med Name: CLONAZEPAM 1 MG TABLET] 60 tablet     Sig: TAKE ONE TABLET BY MOUTH TWICE A DAY AS NEEDED     Not Delegated - Psychiatry: Anxiolytics/Hypnotics 2 Failed - 10/08/2021 10:54 PM      Failed - This refill cannot be delegated      Failed - Urine Drug Screen completed in last 360 days      Failed - Valid encounter within last 6 months    Recent Outpatient Visits           9 months ago Elevated blood pressure reading   [facility] Malva Limes, MD   11 months ago Primary hypertension   [facility] [place], [place]ella [place], [place]   1 year ago Lump in neck   [facility] Malva Limes, MD   1 year ago Iliotibial band syndrome of right side   [facility] Malva Limes, MD   1 year ago Moderate episode of recurrent major depressive disorder [facility])   [facility], Demetrios Isaacs, MD              Passed - Patient is not pregnant

## 2021-11-09 ENCOUNTER — Other Ambulatory Visit: Payer: Self-pay | Admitting: Family Medicine

## 2021-11-09 DIAGNOSIS — F418 Other specified anxiety disorders: Secondary | ICD-10-CM

## 2021-12-06 ENCOUNTER — Other Ambulatory Visit: Payer: Self-pay | Admitting: Family Medicine

## 2021-12-06 DIAGNOSIS — I10 Essential (primary) hypertension: Secondary | ICD-10-CM

## 2022-03-05 ENCOUNTER — Other Ambulatory Visit: Payer: Self-pay | Admitting: Family Medicine

## 2022-03-05 ENCOUNTER — Encounter: Payer: Self-pay | Admitting: *Deleted

## 2022-03-05 DIAGNOSIS — I10 Essential (primary) hypertension: Secondary | ICD-10-CM

## 2022-03-05 NOTE — Telephone Encounter (Signed)
Requested medication (s) are due for refill today: yes  Requested medication (s) are on the active medication list: yes  Last refill:  12/06/21 #90 with 0 RF, curtesy refill  Future visit scheduled: no, last seen 01/06/2021  Notes to clinic:  Has already had a curtesy refill and there is no upcoming appointment scheduled. I have called and left message and sent MyChart message with no response.       Requested Prescriptions  Pending Prescriptions Disp Refills   metoprolol succinate (TOPROL-XL) 25 MG 24 hr tablet [Pharmacy Med Name: METOPROLOL SUCC ER 25 MG TAB] 90 tablet 0    Sig: TAKE ONE TABLET BY MOUTH DAILY     Cardiovascular:  Beta Blockers Failed - 03/05/2022  6:22 AM      Failed - Last BP in normal range    BP Readings from Last 1 Encounters:  01/06/21 (!) 166/109         Failed - Valid encounter within last 6 months    Recent Outpatient Visits           1 year ago Elevated blood pressure reading   [facility] Malva Limes, MD   1 year ago Primary hypertension   [facility], Lavella [place], [place]   1 year ago Lump in neck   [facility] Malva Limes, MD   2 years ago Iliotibial band syndrome of right side   [facility] Malva Limes, MD   2 years ago Moderate episode of recurrent major depressive disorder [facility])   [facility] Malva Limes, MD              Passed - Last Heart Rate in normal range    Pulse Readings from Last 1 Encounters:  01/06/21 75

## 2022-03-05 NOTE — Telephone Encounter (Signed)
Left message on voicemail and sent MyChart message requesting pt schedule appt prior to refills.

## 2022-03-26 ENCOUNTER — Other Ambulatory Visit: Payer: Self-pay | Admitting: Family Medicine

## 2022-03-26 DIAGNOSIS — I10 Essential (primary) hypertension: Secondary | ICD-10-CM

## 2022-03-29 NOTE — Telephone Encounter (Signed)
Called pt  - LMOM to return call to schedule appt.

## 2022-03-31 ENCOUNTER — Other Ambulatory Visit: Payer: Self-pay | Admitting: Family Medicine

## 2022-03-31 DIAGNOSIS — I10 Essential (primary) hypertension: Secondary | ICD-10-CM

## 2022-04-06 ENCOUNTER — Telehealth: Payer: Self-pay | Admitting: Family Medicine

## 2022-04-06 NOTE — Telephone Encounter (Signed)
[facility] faxed refill request for the following medications:  metoprolol succinate (TOPROL-XL) 25 MG 24 hr tablet   Please advise.  

## 2022-04-06 NOTE — Telephone Encounter (Signed)
Patient is overdue for follow up appt. His last ov was 01/06/2021. Patient has been given 2 courtesy refills in the past 2 months. Several attempts have been made to contact patient via telephone. Patient has not answered phone calls, or called [place] back. Mychart message was sent to patient on 03/05/2022, but the message has not been reviewed by patent. I called [facility] and notified pharmacy that patient needs an office visit before we can approve refill request. I also mailed a letter to patients home.

## 2022-04-29 ENCOUNTER — Other Ambulatory Visit: Payer: Self-pay | Admitting: Family Medicine

## 2022-04-29 DIAGNOSIS — I10 Essential (primary) hypertension: Secondary | ICD-10-CM

## 2022-04-29 MED ORDER — METOPROLOL SUCCINATE ER 25 MG PO TB24: 25.0000 mg | ORAL_TABLET | Freq: Every day | ORAL | 0 refills | Status: DC

## 2022-05-03 ENCOUNTER — Other Ambulatory Visit: Payer: Self-pay | Admitting: Family Medicine

## 2022-05-03 DIAGNOSIS — F418 Other specified anxiety disorders: Secondary | ICD-10-CM

## 2022-05-21 ENCOUNTER — Encounter: Payer: Self-pay | Admitting: Family Medicine

## 2022-05-21 ENCOUNTER — Ambulatory Visit: Payer: Commercial Managed Care - PPO | Admitting: Family Medicine

## 2022-05-21 VITALS — BP 124/84 | HR 81 | Temp 98.1°F | Resp 16 | Ht 75.0 in | Wt 190.5 lb

## 2022-05-21 DIAGNOSIS — M25512 Pain in left shoulder: Secondary | ICD-10-CM | POA: Diagnosis not present

## 2022-05-21 DIAGNOSIS — Z23 Encounter for immunization: Secondary | ICD-10-CM | POA: Diagnosis not present

## 2022-05-21 DIAGNOSIS — I10 Essential (primary) hypertension: Secondary | ICD-10-CM | POA: Diagnosis not present

## 2022-05-21 DIAGNOSIS — F418 Other specified anxiety disorders: Secondary | ICD-10-CM | POA: Diagnosis not present

## 2022-05-21 DIAGNOSIS — B372 Candidiasis of skin and nail: Secondary | ICD-10-CM | POA: Diagnosis not present

## 2022-05-21 MED ORDER — SERTRALINE HCL 50 MG PO TABS: 50.0000 mg | ORAL_TABLET | Freq: Every day | ORAL | 2 refills | Status: DC

## 2022-05-21 MED ORDER — KETOCONAZOLE 2 % EX CREA: 1.0000 | TOPICAL_CREAM | Freq: Every day | CUTANEOUS | 1 refills | Status: DC

## 2022-05-21 NOTE — Patient Instructions (Signed)
.   Please review the attached list of medications and notify my office if there are any errors.   . Please bring all of your medications to every appointment so we can make sure that our medication list is the same as yours.   

## 2022-05-21 NOTE — Progress Notes (Unsigned)
I,Sulibeya S Dimas,acting as a scribe for Lelon Huh, MD.,have documented all relevant documentation on the behalf of Lelon Huh, MD,as directed by  Lelon Huh, MD while in the presence of Lelon Huh, MD.     Established patient visit   Patient: Edward Blake   DOB: March 10, 1986   36 y.o. Male  MRN: [id number] Visit Date: 05/21/2022  Today's healthcare provider: Lelon Huh, MD   Chief Complaint  Patient presents with   Hypertension   Anxiety   Subjective    HPI  Hypertension, follow-up  BP Readings from Last 3 Encounters:  05/21/22 124/84  01/06/21 (!) 166/109  10/31/20 (!) 149/100   Wt Readings from Last 3 Encounters:  05/21/22 190 lb 8 oz (86.4 kg)  01/06/21 202 lb 12.8 oz (92 kg)  10/31/20 198 lb 6.4 oz (90 kg)     He was last seen for hypertension 1 years ago.  BP at that visit was 166/109. Management since that visit includes continue metoprolol.  He reports excellent compliance with treatment.  He is not having side effects.  He is following a Regular diet. He is exercising. He does smoke.  Use of agents associated with hypertension: none.   Outside blood pressures are stable. Patient would like to come off medication.  Symptoms: No chest pain No chest pressure  No palpitations No syncope  No dyspnea No orthopnea  No paroxysmal nocturnal dyspnea No lower extremity edema   Pertinent labs Lab Results  Component Value Date   LDLDIRECT 169 (H) 01/06/2021   Lab Results  Component Value Date   NA 142 01/06/2021   K 4.3 01/06/2021   CREATININE 0.91 01/06/2021   EGFR 114 01/06/2021   GLUCOSE 93 01/06/2021   TSH 1.450 01/06/2021     The ASCVD Risk score (Arnett DK, et al., 2019) failed to calculate for the following reasons:   The 2019 ASCVD risk score is only valid for ages 82 to 51  ---------------------------------------------------------------------------------------------------  Anxiety, Follow-up  He was last seen for anxiety 1  years ago. Changes made at last visit include continue sertraline and clonazepam.   He reports excellent compliance with treatment. He reports excellent tolerance of treatment. He is not having side effects.   He feels his anxiety is mild and Improved since last visit. Patient would like to come off medication.  Symptoms: No chest pain No difficulty concentrating  No dizziness No fatigue  No feelings of losing control No insomnia  No irritable No palpitations  No panic attacks No racing thoughts  No shortness of breath No sweating  No tremors/shakes    GAD-7 Results    05/21/2022    2:35 PM 01/15/2020    9:06 AM 04/19/2016    2:08 PM  GAD-7 Generalized Anxiety Disorder Screening Tool  1. Feeling Nervous, Anxious, or on Edge 0 1 1  2. Not Being Able to Stop or Control Worrying 0 0 0  3. Worrying Too Much About Different Things 0 1 1  4. Trouble Relaxing 0 1 0  5. Being So Restless it's Hard To Sit Still 0 1 0  6. Becoming Easily Annoyed or Irritable 0 1 1  7. Feeling Afraid As If Something Awful Might Happen 0 0 1  Total GAD-7 Score 0 5 4  Difficulty At Work, Home, or Getting  Along With Others? Not difficult at all Somewhat difficult Not difficult at all    PHQ-9 Scores    05/21/2022    2:34 PM 10/31/2020  8:32 AM 09/23/2020   11:10 AM  PHQ9 SCORE ONLY  PHQ-9 Total Score 0 0 0    ---------------------------------------------------------------------------------------------------  He also reports itchy rash under his axillae for several weeks.   He also complains of discomfort in back of his left shoulder for 2 weeks. No known injury.   Medications: Outpatient Medications Prior to Visit  Medication Sig   clonazePAM (KLONOPIN) 1 MG tablet TAKE ONE TABLET BY MOUTH TWICE A DAY AS NEEDED   metoprolol succinate (TOPROL-XL) 25 MG 24 hr tablet Take 1 tablet (25 mg total) by mouth daily. Please schedule office visit before any future refill #2 courtesy refill.   sertraline  (ZOLOFT) 100 MG tablet TAKE ONE TABLET BY MOUTH DAILY   No facility-administered medications prior to visit.    Review of Systems  Constitutional:  Negative for appetite change and fatigue.  Respiratory:  Negative for chest tightness and shortness of breath.   Cardiovascular:  Negative for chest pain and palpitations.  Psychiatric/Behavioral:  Negative for agitation, decreased concentration and sleep disturbance. The patient is not nervous/anxious.     {Labs  Heme  Chem  Endocrine  Serology  Results Review (optional):23779}   Objective    BP 124/84 (BP Location: Right Arm, Patient Position: Sitting, Cuff Size: Normal)   Pulse 81   Temp 98.1 F (36.7 C) (Oral)   Resp 16   Ht 6' 3" (1.905 m)   Wt 190 lb 8 oz (86.4 kg)   SpO2 99%   BMI 23.81 kg/m  BP Readings from Last 3 Encounters:  05/21/22 124/84  01/06/21 (!) 166/109  10/31/20 (!) 149/100   Wt Readings from Last 3 Encounters:  05/21/22 190 lb 8 oz (86.4 kg)  01/06/21 202 lb 12.8 oz (92 kg)  10/31/20 198 lb 6.4 oz (90 kg)   Physical Exam   General appearance: Well developed, well nourished male, cooperative and in no acute distress Head: Normocephalic, without obvious abnormality, atraumatic Respiratory: Respirations even and unlabored, normal respiratory rate Extremities: All extremities are intact. FROM of both shoulder, but tender over left AC joint.  Skin: Rash c/w intertrigo both axilla Psych: Appropriate mood and affect. Neurologic: Mental status: Alert, oriented to person, place, and time, thought content appropriate.     Assessment & Plan     1. Primary hypertension Doing well on metoprolol. Is likely related to underlying anxiety which is much better. He would like to try cutting back on sertraline since he has much less stress. Will continue same dose of metoprolol for the time being.   2. Other specified anxiety disorders Doing very well with sertraline. Anxiety was previously job related but he  has much less stressful job now. He would like to work on weaning sertraline.   - sertraline (ZOLOFT) 50 MG tablet; Take 1-1.5 tablets (50-75 mg total) by mouth daily.  Dispense: 90 tablet; Refill: 2  3. Candidal intertrigo  - ketoconazole (NIZORAL) 2 % cream; Apply 1 Application topically daily.  Dispense: 30 g; Refill: 1  4. Acute pain of left shoulder He has some meloxicam prescribed last year and is going to start back on it. Call if not helping.   5. Need for influenza vaccination  - Flu Vaccine QUAD 41moIM (Fluarix, Fluzone & Alfiuria Quad PF)      The entirety of the information documented in the History of Present Illness, Review of Systems and Physical Exam were personally obtained by me. Portions of this information were initially documented by the [place]  reviewed by me for thoroughness and accuracy.     Lelon Huh, MD  [facility] [phone] (phone) [phone] (fax)  [place]

## 2022-05-25 NOTE — Progress Notes (Deleted)
Argentina Ponder Kawika Bischoff,acting as a scribe for Lelon Huh, MD.,have documented all relevant documentation on the behalf of Lelon Huh, MD,as directed by  Lelon Huh, MD while in the presence of Lelon Huh, MD.    Complete physical exam   Patient: Edward Blake   DOB: 1985-12-16   36 y.o. Male  MRN: [id number] Visit Date: 05/26/2022  Today's healthcare provider: Lelon Huh, MD   No chief complaint on file.  Subjective    Edward Blake is a 36 y.o. male who presents today for a complete physical exam.    No past medical history on file. Past Surgical History:  Procedure Laterality Date   NO PAST SURGERIES     Social History   Socioeconomic History   Marital status: Married    Spouse name: Not on file   Number of children: Not on file   Years of education: Not on file   Highest education level: Not on file  Occupational History   Not on file  Tobacco Use   Smoking status: Former    Packs/day: 0.25    Types: Cigarettes, E-cigarettes   Smokeless tobacco: Never   Tobacco comments:    3 cigarettes per week  Substance and Sexual Activity   Alcohol use: Yes    Alcohol/week: 0.0 standard drinks of alcohol    Comment: RARELY   Drug use: No   Sexual activity: Not on file  Other Topics Concern   Not on file  Social History Narrative   Not on file   Social Determinants of Health   Financial Resource Strain: Not on file  Food Insecurity: Not on file  Transportation Needs: Not on file  Physical Activity: Not on file  Stress: Not on file  Social Connections: Not on file  Intimate Partner Violence: Not on file   Family Status  Relation Name Status   Mother  Alive   Father  Alive   Sister  Alive   Family History  Problem Relation Age of Onset   Healthy Mother    Hypertension Father    Migraines Sister    Allergies  Allergen Reactions   Other Rash    Patient Care Team: Birdie Sons, MD as PCP - General (Family Medicine)    Medications: Outpatient Medications Prior to Visit  Medication Sig   clonazePAM (KLONOPIN) 1 MG tablet TAKE ONE TABLET BY MOUTH TWICE A DAY AS NEEDED   ketoconazole (NIZORAL) 2 % cream Apply 1 Application topically daily.   metoprolol succinate (TOPROL-XL) 25 MG 24 hr tablet Take 1 tablet (25 mg total) by mouth daily. Please schedule office visit before any future refill #2 courtesy refill.   sertraline (ZOLOFT) 50 MG tablet Take 1-1.5 tablets (50-75 mg total) by mouth daily.   No facility-administered medications prior to visit.    Review of Systems  Constitutional: Negative.   HENT: Negative.    Eyes: Negative.   Respiratory: Negative.    Cardiovascular: Negative.   Gastrointestinal: Negative.   Endocrine: Negative.   Genitourinary: Negative.   Musculoskeletal: Negative.   Skin: Negative.   Allergic/Immunologic: Negative.   Neurological: Negative.   Hematological: Negative.   Psychiatric/Behavioral: Negative.      {Labs  Heme  Chem  Endocrine  Serology  Results Review (optional):23779}  Objective    There were no vitals taken for this visit. {Show previous vital signs (optional):23777}   Physical Exam  ***  Last depression screening scores    05/21/2022    2:34  PM 10/31/2020    8:32 AM 01/15/2020    9:06 AM  PHQ 2/9 Scores  PHQ - 2 Score 0 0 2  PHQ- 9 Score 0 0 4   Last fall risk screening    05/21/2022    2:34 PM  [place] in the past year? 0  Number falls in past yr: 0  Injury with Fall? 0  Risk for fall due to : No Fall Risks  Follow up Falls evaluation completed   Last Audit-C alcohol use screening    05/21/2022    2:35 PM  Alcohol Use Disorder Test (AUDIT)  1. How often do you have a drink containing alcohol? 3  2. How many drinks containing alcohol do you have on a typical day when you are drinking? 0  3. How often do you have six or more drinks on one occasion? 2  AUDIT-C Score 5   A score of 3 or more in women, and 4 or more  in men indicates increased risk for alcohol abuse, EXCEPT if all of the points are from question 1   No results found for any visits on 05/26/22.  Assessment & Plan    Routine Health Maintenance and Physical Exam  Exercise Activities and Dietary recommendations  Goals   None     Immunization History  Administered Date(s) Administered   DTaP 03/14/1987, 05/16/1987, 07/18/1987, 07/16/1988, 02/07/1992   Hepatitis B 07/03/1998, 07/30/1998, 01/02/1999   IPV 03/14/1987, 05/16/1987, 07/16/1988, 02/07/1992   Influenza,inj,Quad PF,6+ Mos 10/14/2015, 06/22/2017, 05/15/2018, 05/21/2022   MMR 04/23/1988, 02/10/2021   PFIZER(Purple Top)SARS-COV-2 Vaccination 12/24/2008, 08/12/2020   PPD Test 02/03/2021   Pfizer Covid-19 Vaccine Bivalent Booster 57yr & up 08/12/2020, 07/21/2021   Td 09/25/2001, 04/19/2016   Tdap 05/28/2011    Health Maintenance  Topic Date Due   Hepatitis C Screening  Never done   TETANUS/TDAP  04/19/2026   INFLUENZA VACCINE  Completed   COVID-19 Vaccine  Completed   HIV Screening  Completed   HPV VACCINES  Aged Out    Discussed health benefits of physical activity, and encouraged him to engage in regular exercise appropriate for his age and condition.  ***  No follow-ups on file.     {provider attestation***:1}   DLelon Huh MD  [phone](phone) [phone](fax)  [place]

## 2022-05-26 ENCOUNTER — Ambulatory Visit: Payer: Commercial Managed Care - PPO | Admitting: Family Medicine

## 2022-05-30 ENCOUNTER — Other Ambulatory Visit: Payer: Self-pay | Admitting: Family Medicine

## 2022-05-30 DIAGNOSIS — I10 Essential (primary) hypertension: Secondary | ICD-10-CM

## 2022-06-03 ENCOUNTER — Other Ambulatory Visit: Payer: Self-pay | Admitting: Family Medicine

## 2022-06-03 DIAGNOSIS — F418 Other specified anxiety disorders: Secondary | ICD-10-CM

## 2022-06-03 NOTE — Telephone Encounter (Signed)
Requested medication (s) are due for refill today - yes  Requested medication (s) are on the active medication list -yes  Future visit scheduled -no  Last refill: 05/03/22 #30  Notes to clinic: non delegated Rx  Requested Prescriptions  Pending Prescriptions Disp Refills   clonazePAM (KLONOPIN) 1 MG tablet [Pharmacy Med Name: CLONAZEPAM 1 MG TABLET] 30 tablet     Sig: TAKE 1 TABLET BY MOUTH TWICE A DAY AS NEEDED     Not Delegated - Psychiatry: Anxiolytics/Hypnotics 2 Failed - 06/03/2022  3:16 PM      Failed - This refill cannot be delegated      Failed - Urine Drug Screen completed in last 360 days      Passed - Patient is not pregnant      Passed - Valid encounter within last 6 months    Recent Outpatient Visits           1 week ago Primary hypertension   [facility] Birdie Sons, MD   1 year ago Elevated blood pressure reading   [facility] Birdie Sons, MD   1 year ago Primary hypertension   [place], [place]   1 year ago Lump in neck   [facility] Birdie Sons, MD   2 years ago Iliotibial band syndrome of right side   [facility] Birdie Sons, MD                 Requested Prescriptions  Pending Prescriptions Disp Refills   clonazePAM (KLONOPIN) 1 MG tablet [Pharmacy Med Name: CLONAZEPAM 1 MG TABLET] 30 tablet     Sig: TAKE 1 TABLET BY MOUTH TWICE A DAY AS NEEDED     Not Delegated - Psychiatry: Anxiolytics/Hypnotics 2 Failed - 06/03/2022  3:16 PM      Failed - This refill cannot be delegated      Failed - Urine Drug Screen completed in last 360 days      Passed - Patient is not pregnant      Passed - Valid encounter within last 6 months    Recent Outpatient Visits           1 week ago Primary hypertension   [facility] Birdie Sons, MD   1 year ago Elevated blood pressure reading   [facility] Birdie Sons, MD   1 year ago Primary hypertension   [facility] [place], Wendee [place], [place]   1 year ago Lump in neck   [facility] Birdie Sons, MD   2 years ago Iliotibial band syndrome of right side   [facility] Birdie Sons, MD

## 2022-10-01 ENCOUNTER — Other Ambulatory Visit: Payer: Self-pay | Admitting: Family Medicine

## 2022-10-01 DIAGNOSIS — F418 Other specified anxiety disorders: Secondary | ICD-10-CM

## 2022-10-01 MED ORDER — CLONAZEPAM 1 MG PO TABS: 1.0000 mg | ORAL_TABLET | Freq: Two times a day (BID) | ORAL | 3 refills | Status: DC | PRN

## 2022-10-13 ENCOUNTER — Encounter: Payer: Self-pay | Admitting: Family Medicine

## 2022-11-02 ENCOUNTER — Telehealth (INDEPENDENT_AMBULATORY_CARE_PROVIDER_SITE_OTHER): Payer: Commercial Managed Care - PPO | Admitting: Family Medicine

## 2022-11-02 DIAGNOSIS — R197 Diarrhea, unspecified: Secondary | ICD-10-CM

## 2022-11-02 DIAGNOSIS — R6889 Other general symptoms and signs: Secondary | ICD-10-CM | POA: Diagnosis not present

## 2022-11-02 MED ORDER — METRONIDAZOLE 500 MG PO TABS: 500.0000 mg | ORAL_TABLET | Freq: Three times a day (TID) | ORAL | 0 refills | Status: AC

## 2022-11-02 NOTE — Progress Notes (Signed)
[name],acting as a scribe for Lelon Huh, MD.,have documented all relevant documentation on the behalf of Lelon Huh, MD,as directed by  Lelon Huh, MD while in the presence of Lelon Huh, MD.   MyChart Video Visit    Virtual Visit via Video Note   This format is felt to be most appropriate for this patient at this time. Physical exam was limited by quality of the video and audio technology used for the visit.   Patient location: home Provider location: office  I discussed the limitations of evaluation and management by telemedicine and the availability of in person appointments. The patient expressed understanding and agreed to proceed.  Patient: Edward Blake   DOB: 1986/03/30   37 y.o. Male  MRN: [id number] Visit Date: 11/02/2022  Today's healthcare provider: Lelon Huh, MD   Chief Complaint  Patient presents with   Diarrhea   Subjective    HPI  The patient is a 37 year old male who presents via video visit for evaluation of diarrhea.  He states he has been having watery stool several times a day for about 5 days.  He denies any change in diet or medication. No other household members have been sick with same symptoms.  No blood in stool. No nausea or vomiting. No diet changes. Has cut back on sertraline to '25mg'$  QOD over the last couple of months. Is having a little more stress related to recent move, but feels anxiety is under pretty control with prn clonazepam.   Missed work Saturday, Sunday, and today. He also reports on Feb 11th he woke up with fever 103 and flu like symptoms. He missed work that day, but felt much better then next day and was able to RTW.   Medications: Outpatient Medications Prior to Visit  Medication Sig   clonazePAM (KLONOPIN) 1 MG tablet Take 1 tablet (1 mg total) by mouth 2 (two) times daily as needed.   metoprolol succinate (TOPROL-XL) 25 MG 24 hr tablet TAKE 1 TABLET BY MOUTH DAILY PLEASE SCHEDULE OFFICE VISIT BEFORE ANY  FUTURE REFILLS   sertraline (ZOLOFT) 50 MG tablet Take 1-1.5 tablets (50-75 mg total) by mouth daily.   ketoconazole (NIZORAL) 2 % cream Apply 1 Application topically daily.   No facility-administered medications prior to visit.    Review of Systems  Constitutional:  Negative for fever.  Gastrointestinal:  Positive for diarrhea. Negative for abdominal pain, anal bleeding, blood in stool, constipation, nausea, rectal pain and vomiting.      Objective    There were no vitals taken for this visit.   Physical Exam   Awake, alert, oriented x 3. In no apparent distress     Assessment & Plan     1. Diarrhea, unspecified type Starting to improve. Discussed dietary precautions. Push fluids. Work excuse February 24th through November 05, 2022. He may return to work without restriction on November 06, 2022. If not continuing to improve he may start metronidazole to cover parasitic infections.   2. Flu-like symptoms Only one day on 10/17/2022. Work excuse for that day printed and send to pt via Mychart    I discussed the assessment and treatment plan with the patient. The patient was provided an opportunity to ask questions and all were answered. The patient agreed with the plan and demonstrated an understanding of the instructions.   The patient was advised to call back or seek an in-person evaluation if the symptoms worsen or if the condition fails to improve  as anticipated.  I provided 10 minutes of non-face-to-face time during this encounter.  The entirety of the information documented in the History of Present Illness, Review of Systems and Physical Exam were personally obtained by me. Portions of this information were initially documented by the CMA and reviewed by me for thoroughness and accuracy.    Lelon Huh, MD [place] [phone] (phone) [phone] (fax)  [place]

## 2022-11-02 NOTE — Patient Instructions (Signed)
.   Please review the attached list of medications and notify my office if there are any errors.   . Please bring all of your medications to every appointment so we can make sure that our medication list is the same as yours.   

## 2022-11-23 ENCOUNTER — Other Ambulatory Visit: Payer: Self-pay | Admitting: Family Medicine

## 2022-11-23 DIAGNOSIS — I10 Essential (primary) hypertension: Secondary | ICD-10-CM

## 2022-11-24 NOTE — Telephone Encounter (Signed)
Requested Prescriptions  Pending Prescriptions Disp Refills   metoprolol succinate (TOPROL-XL) 25 MG 24 hr tablet [Pharmacy Med Name: METOPROLOL SUCC ER 25 MG TAB] 90 tablet 0    Sig: TAKE 1 TABLET BY MOUTH DAILY     Cardiovascular:  Beta Blockers Passed - 11/23/2022 10:13 AM      Passed - Last BP in normal range    BP Readings from Last 1 Encounters:  05/21/22 124/84         Passed - Last Heart Rate in normal range    Pulse Readings from Last 1 Encounters:  05/21/22 81         Passed - Valid encounter within last 6 months    Recent Outpatient Visits           3 weeks ago Diarrhea, unspecified type   [facility] Birdie Sons, MD   6 months ago Primary hypertension   [place] Birdie Sons, MD   1 year ago Elevated blood pressure reading   [facility] Birdie Sons, MD   2 years ago Primary hypertension   [place], Fabio Bering [place], [place]   2 years ago Lump in neck   [place], Kirstie Peri, [place]

## 2023-01-18 ENCOUNTER — Other Ambulatory Visit: Payer: Self-pay | Admitting: Family Medicine

## 2023-01-18 DIAGNOSIS — F418 Other specified anxiety disorders: Secondary | ICD-10-CM

## 2023-01-18 NOTE — Telephone Encounter (Signed)
Requested medication (s) are due for refill today: Yes  Requested medication (s) are on the active medication list: Yes  Last refill:  10/01/22  Future visit scheduled: No  Notes to clinic:  See request.    Requested Prescriptions  Pending Prescriptions Disp Refills   clonazePAM (KLONOPIN) 1 MG tablet [Pharmacy Med Name: clonazePAM 1 MG TABLET] 30 tablet     Sig: TAKE 1 TABLET BY MOUTH TWICE A DAY AS NEEDED     Not Delegated - Psychiatry: Anxiolytics/Hypnotics 2 Failed - 01/18/2023  1:51 PM      Failed - This refill cannot be delegated      Failed - Urine Drug Screen completed in last 360 days      Passed - Patient is not pregnant      Passed - Valid encounter within last 6 months    Recent Outpatient Visits           2 months ago Diarrhea, unspecified type   [facility] Malva Limes, MD   8 months ago Primary hypertension   [facility] Malva Limes, MD   2 years ago Elevated blood pressure reading   [facility] Malva Limes, MD   2 years ago Primary hypertension   [facility] [place], Ricki Rodriguez [place], [place]   2 years ago Lump in neck   [facility] Sherrie Mustache, Demetrios Isaacs, MD

## 2023-03-29 ENCOUNTER — Other Ambulatory Visit: Payer: Self-pay | Admitting: Family Medicine

## 2023-03-29 DIAGNOSIS — I10 Essential (primary) hypertension: Secondary | ICD-10-CM

## 2023-04-16 ENCOUNTER — Other Ambulatory Visit: Payer: Self-pay | Admitting: Family Medicine

## 2023-04-16 DIAGNOSIS — F418 Other specified anxiety disorders: Secondary | ICD-10-CM

## 2023-05-18 ENCOUNTER — Other Ambulatory Visit: Payer: Self-pay | Admitting: Family Medicine

## 2023-05-18 DIAGNOSIS — F418 Other specified anxiety disorders: Secondary | ICD-10-CM

## 2023-06-18 ENCOUNTER — Other Ambulatory Visit: Payer: Self-pay | Admitting: Family Medicine

## 2023-06-18 DIAGNOSIS — F418 Other specified anxiety disorders: Secondary | ICD-10-CM

## 2023-07-17 ENCOUNTER — Other Ambulatory Visit: Payer: Self-pay | Admitting: Family Medicine

## 2023-07-17 DIAGNOSIS — F418 Other specified anxiety disorders: Secondary | ICD-10-CM

## 2023-07-18 NOTE — Progress Notes (Unsigned)
      Established patient visit   Patient: Edward Blake   DOB: [birth year]   37 y.o. Male  MRN: [id number] Visit Date: 07/19/2023  Today's healthcare provider: Ronnald Ramp, MD   No chief complaint on file.  Subjective       Discussed the use of AI scribe software for clinical note transcription with the patient, who gave verbal consent to proceed.  History of Present Illness             No past medical history on file.  Medications: Outpatient Medications Prior to Visit  Medication Sig   clonazePAM (KLONOPIN) 1 MG tablet TAKE 1 TABLET BY MOUTH TWICE A DAY AS NEEDED   metoprolol succinate (TOPROL-XL) 25 MG 24 hr tablet TAKE 1 TABLET BY MOUTH DAILY   sertraline (ZOLOFT) 50 MG tablet Take 1-1.5 tablets (50-75 mg total) by mouth daily.   No facility-administered medications prior to visit.    Review of Systems  {Insert previous labs (optional):23779} {See past labs  Heme  Chem  Endocrine  Serology  Results Review (optional):1}   Objective    There were no vitals taken for this visit. {Insert last BP/Wt (optional):23777}{See vitals history (optional):1}      Physical Exam  ***  No results found for any visits on 07/19/23.  Assessment & Plan     Problem List Items Addressed This Visit   None   Assessment and Plan              No follow-ups on file.         Ronnald Ramp, MD  [facility] [phone] (phone) [phone] (fax)  [facility]

## 2023-07-19 ENCOUNTER — Ambulatory Visit: Payer: Commercial Managed Care - PPO | Admitting: Family Medicine

## 2023-07-19 ENCOUNTER — Encounter: Payer: Self-pay | Admitting: Family Medicine

## 2023-07-19 VITALS — BP 136/109 | HR 78 | Temp 97.5°F | Resp 18 | Ht 75.0 in | Wt 181.6 lb

## 2023-07-19 DIAGNOSIS — M7711 Lateral epicondylitis, right elbow: Secondary | ICD-10-CM | POA: Diagnosis not present

## 2023-07-19 DIAGNOSIS — F331 Major depressive disorder, recurrent, moderate: Secondary | ICD-10-CM

## 2023-07-19 DIAGNOSIS — T148XXA Other injury of unspecified body region, initial encounter: Secondary | ICD-10-CM

## 2023-07-19 DIAGNOSIS — I10 Essential (primary) hypertension: Secondary | ICD-10-CM | POA: Diagnosis not present

## 2023-07-19 NOTE — Assessment & Plan Note (Signed)
Blood pressure elevated at 155/115 mmHg. On metoprolol 25 mg daily but inconsistent intake. Recent BP at [facility] was 135/90 mmHg. 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 - Continue metoprolol 25 mg daily - Follow up with primary care physician, for further management

## 2023-07-19 NOTE — Assessment & Plan Note (Signed)
  Depression Reports emotional numbness and disconnection on sertraline, similar to previous experience with Prozac. Values empathy in CNA work, negatively impacted by current medication. - Discuss alternative antidepressant options with primary care physician

## 2023-07-19 NOTE — Assessment & Plan Note (Signed)
Diagnosed by orthopedics. Symptoms include pain from the elbow down the forearm, exacerbated by extension, gripping, and lifting. Pain interferes with CNA duties. Initial treatment with Robaxin 750 mg and ibuprofen has provided some relief. Following prescribed exercises. Prefers work restrictions over complete absence to avoid reversing progress. acute, improved but not fully recovered  -continue ibuprofen 600mg  as previously prescribed  - Refer to physical therapy - Write FMLA documentation including restrictions on pushing, pulling, and lifting more than 10 pounds - Continue Robaxin 750 mg and ibuprofen as prescribed

## 2023-07-21 ENCOUNTER — Ambulatory Visit: Payer: Commercial Managed Care - PPO | Attending: Family Medicine | Admitting: Physical Therapy

## 2023-07-21 VITALS — BP 130/78 | HR 72

## 2023-07-21 DIAGNOSIS — M25521 Pain in right elbow: Secondary | ICD-10-CM | POA: Diagnosis present

## 2023-07-21 DIAGNOSIS — M7711 Lateral epicondylitis, right elbow: Secondary | ICD-10-CM | POA: Diagnosis not present

## 2023-07-21 NOTE — Therapy (Signed)
OUTPATIENT PHYSICAL THERAPY UPPER EXTREMITY EVALUATION   Patient Name: Edward Blake MRN: [id number] DOB:18-Apr-1986, 37 y.o., male Today's Date: 07/21/2023  END OF SESSION:  PT End of Session - 07/21/23 1815     Visit Number 1    Number of Visits 20    Date for PT Re-Evaluation 09/29/23    PT Start Time 1648    PT Stop Time 1733    PT Time Calculation (min) 45 min    Activity Tolerance Patient tolerated treatment well    Behavior During Therapy [facility] for tasks assessed/performed             No past medical history on file. Past Surgical History:  Procedure Laterality Date   NO PAST SURGERIES     Patient Active Problem List   Diagnosis Date Noted   Primary hypertension 07/19/2023   Lateral epicondylitis of right elbow 07/19/2023   Iliotibial band syndrome of right side 02/08/2020   Moderate episode of recurrent major depressive disorder (HCC) 04/02/2019   Back pain 03/07/2017   Current tobacco use 10/14/2015   Restless leg syndrome 04/29/2015   Major depressive disorder 04/29/2015   Panic disorder 04/29/2015   Anxiety disorder 04/29/2015    PCP:  Malva Limes, MD    REFERRING PROVIDER: Ronnald Ramp, MD   REFERRING DIAG: M77.11 (ICD-10-CM) - Lateral epicondylitis of right elbow   THERAPY DIAG:  Right elbow pain  Rationale for Evaluation and Treatment: Rehabilitation  ONSET DATE: 07/08/2023  SUBJECTIVE:                                                                                                                                                                                      SUBJECTIVE STATEMENT: 0/10 pain right now. Pain hurts most with gripping, pushing, and pulling activities.  Hand dominance: Right  PERTINENT HISTORY: Right elbow pain to mid forearm. Feels soreness when gripping. Has done some stretching and strength exercises which has helped some. Started 2 weeks ago the morning after several 12 and 8 hour shifts and hasn't  gone away. Has tried ice, which helps. Had bruise on right shoulder the morning of waking up. 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 Pain can increase to 8/10 when gripping. Even helping move lighter weight pt recreates pain.   PAIN:  Are you having pain? Yes: NPRS scale: 0(now)-8(worst)/10 Pain location: Lateral elbow and proximal dorsolateral forearm  Pain description: Sore usually, can be sharp Aggravating factors: Gripping, pushing, pulling Relieving factors: Ice, NSAIDs  PRECAUTIONS: None  RED FLAGS: None   WEIGHT BEARING RESTRICTIONS: No  FALLS:  Has patient fallen in last 6 months? No  LIVING ENVIRONMENT: Lives with: lives with an adult companion and lives with their partner Lives in: House/apartment Stairs: Yes: Internal: 10 steps; can reach both and External: 5 steps; can reach both Has following equipment at home: None  OCCUPATION: CNA  PLOF: Independent  PATIENT GOALS: Build strength back up without overcompensating with left arm. Decrease pain. Return to work pain free  NEXT MD VISIT: 12/3  OBJECTIVE:  Note: Objective measures were completed at Evaluation unless otherwise noted.   PATIENT SURVEYS :  FOTO 39 today with goal of 69  COGNITION: Overall cognitive status: Within functional limits for tasks assessed     SENSATION: Not tested   UPPER EXTREMITY ROM:   Active ROM Right eval Left eval  Shoulder flexion    Shoulder extension    Shoulder abduction    Shoulder adduction    Shoulder internal rotation    Shoulder external rotation    Elbow flexion    Elbow extension    Wrist flexion 55/55 57/57  Wrist extension 70/70 70/70  Wrist ulnar deviation 45 45  Wrist radial deviation 40 40  Wrist pronation 70 65  Wrist supination 80 75  (Blank rows = not tested)  UPPER EXTREMITY MMT:  MMT Right eval Left eval  Shoulder flexion    Shoulder extension    Shoulder abduction     Shoulder adduction    Shoulder internal rotation    Shoulder external rotation    Middle trapezius    Lower trapezius    Elbow flexion 4-* 4+ with supinated grip 4+  Elbow extension 4* 4+  Wrist flexion 4+ 4+  Wrist extension 3+ * 4+  Wrist ulnar deviation 5 5  Wrist radial deviation 4- 5  Wrist pronation 4* 5  Wrist supination 5 5  Grip strength (lbs)    (Blank rows = not tested)  *all pain from lateral elbow and dorsolateral forearm  ELBOW SPECIAL TESTS: Cozen's Test: Positive   PALPATION:  Pain at distal, lateral elbow and proximal, dorsolateral forearm   TODAY'S TREATMENT:                                                                                                                                         DATE:   07/21/2023 Resisted wrist extension with green TB 1x12 Resisted wrist ulnar deviation with green TB 1x12   PATIENT EDUCATION: Education details: Pt educated on course of PT, PT findings, HEP exercises and how to access HEP Person educated: Patient Education method: Explanation Education comprehension: verbalized understanding  HOME EXERCISE PROGRAM: Resisted radial deviation with green band - 3x12 - 1x/day Resisted wrist extension with green band - 3x12 - 1x/day  ASSESSMENT:  CLINICAL IMPRESSION: Patient is a 37 y.o. white male who was seen today for physical therapy evaluation and treatment for lateral elbow pain. Pt symptoms are  concordant with lateral epicondylitis as evidenced by location of pain, pain with with gripping, radial deviation, wrist extension, elbow flexion with neutral or pronated forearm but not when supinated, and positive Cozen's test. Pt deficits include decreased strength and increased pain of right elbow and wrist. These deficits limit activities such as carrying, gripping, and lifting which restricts his participation in work as a Lawyer, driving, doing laundry, and anything involving gripping. PT will be beneficial to address  aforementioned deficits and help patient return to work as Lawyer.    OBJECTIVE IMPAIRMENTS: decreased strength and pain.   ACTIVITY LIMITATIONS: carrying, lifting, hygiene/grooming, and caring for others  PARTICIPATION LIMITATIONS: cleaning, laundry, driving, community activity, occupation, and yard work  PERSONAL FACTORS: Age and Time since onset of injury/illness/exacerbation are also affecting patient's functional outcome.   REHAB POTENTIAL: Good - pt is young and his pain is not chronic  CLINICAL DECISION MAKING: Stable/uncomplicated  EVALUATION COMPLEXITY: Low  GOALS: Goals reviewed with patient? No  SHORT TERM GOALS: Target date: 08/04/2023    Patient will demonstrate independent understanding of HEP to be able effectively manage underlying MSK condition.   Baseline: Not tested Goal status: INITIAL     LONG TERM GOALS: Target date: 09/29/2023    Patient will have improved function and activity level as evidenced by an increase in FOTO score by 10 points or more.   Baseline: 39 Goal status: INITIAL  2.  Pt will increase wrist and elbow strength by 1/3 grade MMT (i.e. 4- to 4) or more to assist in return to his work activities as a Lawyer Baseline:  Elbow (R/L) - extension: 4/4+; flexion (neutral forearm) 4-/4+; pronation: 4/5 Wrist (R/L) - extension:  3+/4+, radial deviation 4-/5 Goal status: INITIAL  3.  Pt will be able to grip with max effort without increasing pain >3/10 on NPRS in order to perform lifting and grabbing tasks associated with ADLs and job as a CNA Baseline: NT  Goal status: INITIAL    PLAN: PT FREQUENCY: 1-2x/week  PT DURATION: 10 weeks  PLANNED INTERVENTIONS: 97164- PT Re-evaluation, 97110-Therapeutic exercises, 97530- Therapeutic activity, 97112- Neuromuscular re-education, 97535- Self Care, 16109- Manual therapy, Dry Needling, and Joint mobilization  PLAN FOR NEXT SESSION: Continue resistance training of wrist extensors and radial deviators,  assess grip with handheld dynomometer, discuss lateral epicondylitis strap   Arcelia Jew, Student-PT 07/21/2023, 6:20 PM

## 2023-07-26 ENCOUNTER — Ambulatory Visit: Payer: Commercial Managed Care - PPO | Admitting: Physical Therapy

## 2023-07-28 ENCOUNTER — Ambulatory Visit: Payer: Commercial Managed Care - PPO | Admitting: Physical Therapy

## 2023-07-28 DIAGNOSIS — M25521 Pain in right elbow: Secondary | ICD-10-CM | POA: Diagnosis not present

## 2023-07-28 NOTE — Therapy (Addendum)
OUTPATIENT PHYSICAL THERAPY UPPER EXTREMITY TREATMENT   Patient Name: Edward Blake MRN: [id number] DOB:1985-09-18, 37 y.o., male Today's Date: 07/28/2023  END OF SESSION:  PT End of Session - 07/28/23 1755     Visit Number 2    Number of Visits 20    Date for PT Re-Evaluation 09/29/23    PT Start Time 1647    PT Stop Time 1728    PT Time Calculation (min) 41 min    Activity Tolerance Patient tolerated treatment well    Behavior During Therapy [facility] for tasks assessed/performed              No past medical history on file. Past Surgical History:  Procedure Laterality Date   NO PAST SURGERIES     Patient Active Problem List   Diagnosis Date Noted   Primary hypertension 07/19/2023   Lateral epicondylitis of right elbow 07/19/2023   Iliotibial band syndrome of right side 02/08/2020   Moderate episode of recurrent major depressive disorder (HCC) 04/02/2019   Back pain 03/07/2017   Current tobacco use 10/14/2015   Restless leg syndrome 04/29/2015   Major depressive disorder 04/29/2015   Panic disorder 04/29/2015   Anxiety disorder 04/29/2015    PCP:  Malva Limes, MD    REFERRING PROVIDER: Ronnald Ramp, MD   REFERRING DIAG: M77.11 (ICD-10-CM) - Lateral epicondylitis of right elbow   THERAPY DIAG:  Right elbow pain  Rationale for Evaluation and Treatment: Rehabilitation  ONSET DATE: 07/08/2023  SUBJECTIVE:                                                                                                                                                                                      SUBJECTIVE STATEMENT: 0/10 pain right now. Pt reports being in less pain since last visit, especially since waking up.  PERTINENT HISTORY: Right elbow pain to mid forearm. Feels soreness when gripping. Has done some stretching and strength exercises which has helped some. Started 2 weeks ago the morning after several 12 and 8 hour shifts and hasn't gone away. Has  tried ice, which helps. Had bruise on right shoulder the morning of waking up. 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 Pain can increase to 8/10 when gripping. Even helping move lighter weight pt recreates pain.   PAIN:  Are you having pain? Yes: NPRS scale: 0(now)-8(worst)/10 Pain location: Lateral elbow and proximal dorsolateral forearm  Pain description: Sore usually, can be sharp Aggravating factors: Gripping, pushing, pulling Relieving factors: Ice, NSAIDs  PRECAUTIONS: None  RED FLAGS: None   WEIGHT BEARING RESTRICTIONS:  No  FALLS:  Has patient fallen in last 6 months? No  LIVING ENVIRONMENT: Lives with: lives with an adult companion and lives with their partner Lives in: House/apartment Stairs: Yes: Internal: 10 steps; can reach both and External: 5 steps; can reach both Has following equipment at home: None  OCCUPATION: CNA  PLOF: Independent  PATIENT GOALS: Build strength back up without overcompensating with left arm. Decrease pain. Return to work pain free  NEXT MD VISIT: 12/3  OBJECTIVE:  Note: Objective measures were completed at Evaluation unless otherwise noted.   PATIENT SURVEYS :  FOTO 39 today with goal of 46  COGNITION: Overall cognitive status: Within functional limits for tasks assessed     SENSATION: Not tested   UPPER EXTREMITY ROM:   Active ROM Right eval Left eval  Shoulder flexion    Shoulder extension    Shoulder abduction    Shoulder adduction    Shoulder internal rotation    Shoulder external rotation    Elbow flexion    Elbow extension    Wrist flexion 55/55 57/57  Wrist extension 70/70 70/70  Wrist ulnar deviation 45 45  Wrist radial deviation 40 40  Wrist pronation 70 65  Wrist supination 80 75  (Blank rows = not tested)  UPPER EXTREMITY MMT:  MMT Right eval Left eval  Shoulder flexion    Shoulder extension    Shoulder abduction    Shoulder adduction     Shoulder internal rotation    Shoulder external rotation    Middle trapezius    Lower trapezius    Elbow flexion 4-* 4+ with supinated grip 4+  Elbow extension 4* 4+  Wrist flexion 4+ 4+  Wrist extension 3+ * 4+  Wrist ulnar deviation 5 5  Wrist radial deviation 4- 5  Wrist pronation 4* 5  Wrist supination 5 5  Grip strength (lbs) 110* 102  (Blank rows = not tested)  *all pain from lateral elbow and dorsolateral forearm  ELBOW SPECIAL TESTS: Cozen's Test: Positive   PALPATION:  Pain at distal, lateral elbow and proximal, dorsolateral forearm   TODAY'S TREATMENT:                                                                                                                                         DATE:  07/21/2023  THEREX Resisted wrist extension with green TB 2x10 Resisted wrist radial deviation with green TB 2x10 OMEGA combined radial deviation and extension 3x8 @5 # Eccentric wrist extension 3x8 @6lb  dumbbell  MANUAL  AP proximal radius mobilizations @grade  2 for 2 minutes PA proximal radius mobilizations @grade  2 for 2 minutes Soft tissue and trigger point massage for 5 minutes Neutral grip elbow extension with wrist extensor resistance (green TB) and 2# dumbbell 2x8  07/21/2023 Resisted wrist extension with green TB 1x12 Resisted wrist radial  deviation with green TB 1x12   PATIENT EDUCATION: Education  details: Pt educated on course of PT, PT findings, HEP exercises and how to access HEP Person educated: Patient Education method: Explanation Education comprehension: verbalized understanding  HOME EXERCISE PROGRAM: Resisted radial deviation with green band - 3x12 - 1x/day Resisted wrist extension with green band - 3x12 - 1x/day  ASSESSMENT:  CLINICAL IMPRESSION: Patient presents for first treatment for right lateral elbow pain. PT focused on right wrist extensor and radial deviator strengthening exercises with some manual therapy to reduce pain and  trigger points. Pt was successful in performing new right side elbow extensor and radial deviator strengthening exercises with good form and noticeable soreness but not increased pain. This shows he is progressing towards goals and increased wrist extensor strengthening tolerance. PT will be beneficial to address aforementioned deficits and help patient return to work as Lawyer.    OBJECTIVE IMPAIRMENTS: decreased strength and pain.   ACTIVITY LIMITATIONS: carrying, lifting, hygiene/grooming, and caring for others  PARTICIPATION LIMITATIONS: cleaning, laundry, driving, community activity, occupation, and yard work  PERSONAL FACTORS: Age and Time since onset of injury/illness/exacerbation are also affecting patient's functional outcome.   REHAB POTENTIAL: Good - pt is young and his pain is not chronic  CLINICAL DECISION MAKING: Stable/uncomplicated  EVALUATION COMPLEXITY: Low  GOALS: Goals reviewed with patient? No  SHORT TERM GOALS: Target date: 08/04/2023    Patient will demonstrate independent understanding of HEP to be able effectively manage underlying MSK condition.   Baseline: Not tested Goal status: ONGOING     LONG TERM GOALS: Target date: 09/29/2023    Patient will have improved function and activity level as evidenced by an increase in FOTO score by 10 points or more.   Baseline: 39 Goal status: ONGOING  2.  Pt will increase wrist and elbow strength by 1/3 grade MMT (i.e. 4- to 4) or more to assist in return to his work activities as a Lawyer Baseline:  Elbow (R/L) - extension: 4/4+; flexion (neutral forearm) 4-/4+; pronation: 4/5 Wrist (R/L) - extension:  3+/4+, radial deviation 4-/5 Goal status: ONGOING  3.  Pt will be able to grip with max effort without increasing pain >3/10 on NPRS in order to perform lifting and grabbing tasks associated with ADLs and job as a CNA Baseline: NT  Goal status: ONGOING    PLAN: PT FREQUENCY: 1-2x/week  PT DURATION: 10  weeks  PLANNED INTERVENTIONS: 97164- PT Re-evaluation, 97110-Therapeutic exercises, 97530- Therapeutic activity, 97112- Neuromuscular re-education, 97535- Self Care, 16109- Manual therapy, Dry Needling, and Joint mobilization  PLAN FOR NEXT SESSION: Continue resistance training of wrist extensors and radial deviators, focus on wrist extension while using other muscles  Arcelia Jew, Student-PT 07/28/2023, 5:59 PM

## 2023-08-02 ENCOUNTER — Telehealth: Payer: Self-pay | Admitting: Physical Therapy

## 2023-08-02 ENCOUNTER — Ambulatory Visit: Payer: Commercial Managed Care - PPO | Admitting: Physical Therapy

## 2023-08-02 NOTE — Telephone Encounter (Signed)
Pt contacted to inquire about missing apt. Pt reports having a lot going on and that he plans on being at next apt.

## 2023-08-08 ENCOUNTER — Ambulatory Visit: Payer: Commercial Managed Care - PPO | Attending: Family Medicine | Admitting: Physical Therapy

## 2023-08-08 DIAGNOSIS — M25521 Pain in right elbow: Secondary | ICD-10-CM | POA: Insufficient documentation

## 2023-08-08 NOTE — Progress Notes (Unsigned)
      Established patient visit   Patient: Edward Blake   DOB: May 27, 1986   37 y.o. Male  MRN: [id number] Visit Date: 08/09/2023  Today's healthcare provider: Ronnald Ramp, MD   No chief complaint on file.  Subjective       Discussed the use of AI scribe software for clinical note transcription with the patient, who gave verbal consent to proceed.  History of Present Illness             No past medical history on file.  Medications: Outpatient Medications Prior to Visit  Medication Sig   clonazePAM (KLONOPIN) 1 MG tablet TAKE 1 TABLET BY MOUTH TWICE A DAY AS NEEDED   ibuprofen (ADVIL) 600 MG tablet Take 600 mg by mouth every 6 (six) hours as needed.   methocarbamol (ROBAXIN) 750 MG tablet Take 750 mg by mouth 4 (four) times daily.   metoprolol succinate (TOPROL-XL) 25 MG 24 hr tablet TAKE 1 TABLET BY MOUTH DAILY   sertraline (ZOLOFT) 50 MG tablet Take 1-1.5 tablets (50-75 mg total) by mouth daily. (Patient not taking: Reported on 07/19/2023)   No facility-administered medications prior to visit.    Review of Systems  {Insert previous labs (optional):23779} {See past labs  Heme  Chem  Endocrine  Serology  Results Review (optional):1}   Objective    There were no vitals taken for this visit. {Insert last BP/Wt (optional):23777}{See vitals history (optional):1}    Physical Exam  ***  No results found for any visits on 08/09/23.  Assessment & Plan     Problem List Items Addressed This Visit   None   Assessment and Plan              No follow-ups on file.         Ronnald Ramp, MD  [facility] [phone] (phone) [phone] (fax)  [facility]

## 2023-08-09 ENCOUNTER — Ambulatory Visit: Payer: Commercial Managed Care - PPO | Admitting: Family Medicine

## 2023-08-09 DIAGNOSIS — I10 Essential (primary) hypertension: Secondary | ICD-10-CM

## 2023-08-14 ENCOUNTER — Other Ambulatory Visit: Payer: Self-pay | Admitting: Medical Genetics

## 2023-08-15 ENCOUNTER — Ambulatory Visit: Payer: Commercial Managed Care - PPO | Admitting: Physical Therapy

## 2023-08-15 DIAGNOSIS — M25521 Pain in right elbow: Secondary | ICD-10-CM

## 2023-08-15 NOTE — Therapy (Signed)
OUTPATIENT PHYSICAL THERAPY UPPER EXTREMITY TREATMENT   Patient Name: Edward Blake MRN: [id number] DOB:03-14-1986, 37 y.o., male Today's Date: 08/15/2023  END OF SESSION:  PT End of Session - 08/15/23 1438     Visit Number 3    Number of Visits 20    Date for PT Re-Evaluation 09/29/23    Authorization - Visit Number 3    Progress Note Due on Visit 10    PT Start Time 1345    PT Stop Time 1430    PT Time Calculation (min) 45 min    Activity Tolerance Patient tolerated treatment well    Behavior During Therapy [facility] for tasks assessed/performed               No past medical history on file. Past Surgical History:  Procedure Laterality Date   NO PAST SURGERIES     Patient Active Problem List   Diagnosis Date Noted   Primary hypertension 07/19/2023   Lateral epicondylitis of right elbow 07/19/2023   Iliotibial band syndrome of right side 02/08/2020   Moderate episode of recurrent major depressive disorder (HCC) 04/02/2019   Back pain 03/07/2017   Current tobacco use 10/14/2015   Restless leg syndrome 04/29/2015   Major depressive disorder 04/29/2015   Panic disorder 04/29/2015   Anxiety disorder 04/29/2015    PCP:  Malva Limes, MD    REFERRING PROVIDER: Ronnald Ramp, MD   REFERRING DIAG: M77.11 (ICD-10-CM) - Lateral epicondylitis of right elbow   THERAPY DIAG:  Right elbow pain  Rationale for Evaluation and Treatment: Rehabilitation  ONSET DATE: 07/08/2023  SUBJECTIVE:                                                                                                                                                                                      SUBJECTIVE STATEMENT: 0/10 pain right now. Pt reports elbow mostly feeling sore, not painful. Sometimes grip will still cause more pain.   PERTINENT HISTORY: Right elbow pain to mid forearm. Feels soreness when gripping. Has done some stretching and strength exercises which has helped some.  Started 2 weeks ago the morning after several 12 and 8 hour shifts and hasn't gone away. Has tried ice, which helps. Had bruise on right shoulder the morning of waking up. 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 Pain can increase to 8/10 when gripping. Even helping move lighter weight pt recreates pain.   PAIN:  Are you having pain? Yes: NPRS scale: 0(now)-8(worst)/10 Pain location: Lateral elbow and proximal dorsolateral forearm  Pain description: Sore usually, can be sharp  Aggravating factors: Gripping, pushing, pulling Relieving factors: Ice, NSAIDs  PRECAUTIONS: None  RED FLAGS: None   WEIGHT BEARING RESTRICTIONS: No  FALLS:  Has patient fallen in last 6 months? No  LIVING ENVIRONMENT: Lives with: lives with an adult companion and lives with their partner Lives in: House/apartment Stairs: Yes: Internal: 10 steps; can reach both and External: 5 steps; can reach both Has following equipment at home: None  OCCUPATION: CNA  PLOF: Independent  PATIENT GOALS: Build strength back up without overcompensating with left arm. Decrease pain. Return to work pain free  NEXT MD VISIT: 12/3  OBJECTIVE:  Note: Objective measures were completed at Evaluation unless otherwise noted.   PATIENT SURVEYS :  FOTO 39 today with goal of 18  COGNITION: Overall cognitive status: Within functional limits for tasks assessed     SENSATION: Not tested   UPPER EXTREMITY ROM:   Active ROM Right eval Left eval  Shoulder flexion    Shoulder extension    Shoulder abduction    Shoulder adduction    Shoulder internal rotation    Shoulder external rotation    Elbow flexion    Elbow extension    Wrist flexion 55/55 57/57  Wrist extension 70/70 70/70  Wrist ulnar deviation 45 45  Wrist radial deviation 40 40  Wrist pronation 70 65  Wrist supination 80 75  (Blank rows = not tested)  UPPER EXTREMITY MMT:  MMT Right eval  Left eval  Shoulder flexion    Shoulder extension    Shoulder abduction    Shoulder adduction    Shoulder internal rotation    Shoulder external rotation    Middle trapezius    Lower trapezius    Elbow flexion 4-* 4+ with supinated grip 4+  Elbow extension 4* 4+  Wrist flexion 4+ 4+  Wrist extension 3+ * 4+  Wrist ulnar deviation 5 5  Wrist radial deviation 4- 5  Wrist pronation 4* 5  Wrist supination 5 5  Grip strength (lbs) 110* 102  (Blank rows = not tested)  *all pain from lateral elbow and dorsolateral forearm  ELBOW SPECIAL TESTS: Cozen's Test: Positive   PALPATION:  Pain at distal, lateral elbow and proximal, dorsolateral forearm   TODAY'S TREATMENT:                                                                                                                                         DATE:  08/15/23 Warm up: UE bike 2.5 min forward peddling, 2.5 min backwards Resisted wrist extension with blue TB 2x15  - sore around elbow  - easy at first, gets to 9/10 OMEGA wrist radial deviation using lat pulldown 1x5@ 5#  - 8-9/10 on NPRS Wrist extension holding end of dumbbell 1x8, 1x11 @5 # Wrist extension holding end of dumbbell 1x8 @6 #  - assisted concentric on last 3 Resisted wrist extension with blue theraband and tennis  ball squeeze 3x10  - 8-9/10 pain on last rep   07/21/2023 THEREX Resisted wrist extension with green TB 2x10 Resisted wrist radial deviation with green TB 2x10 OMEGA combined radial deviation and extension 3x8 @5 # Eccentric wrist extension 3x8 @6lb  dumbbell  MANUAL  AP proximal radius mobilizations @grade  2 for 2 minutes PA proximal radius mobilizations @grade  2 for 2 minutes Soft tissue and trigger point massage for 5 minutes Neutral grip elbow extension with wrist extensor resistance (green TB) and 2# dumbbell 2x8  07/21/2023 Resisted wrist extension with green TB 1x12 Resisted wrist radial  deviation with green TB 1x12   PATIENT  EDUCATION: Education details: Pt educated on course of PT, PT findings, HEP exercises and how to access HEP Person educated: Patient Education method: Explanation Education comprehension: verbalized understanding  HOME EXERCISE PROGRAM: Access Code: [email or web address] URL: [email or web address] Date: 08/15/2023 Prepared by: Ellin Goodie  Exercises - Seated Wrist Radial Deviation with Dumbbell  - 3-4 x weekly - 3 sets - 12 reps - Seated Wrist Extension with Dumbbell  - 3-4 x weekly - 3 sets - 12 reps  ASSESSMENT:  CLINICAL IMPRESSION: Patient presents for first treatment for right lateral elbow pain. PT focused on right wrist extensor and radial deviator strengthening exercises with and without added grip components. Pt successfully performed this exercises with increased resistance and without compensation which shows progress towards goals. He was also able to perform resisted wrist extension with added grip component to work towards increased hand function. Further PT will be beneficial to address aforementioned deficits and help patient return to work as Lawyer.    OBJECTIVE IMPAIRMENTS: decreased strength and pain.   ACTIVITY LIMITATIONS: carrying, lifting, hygiene/grooming, and caring for others  PARTICIPATION LIMITATIONS: cleaning, laundry, driving, community activity, occupation, and yard work  PERSONAL FACTORS: Age and Time since onset of injury/illness/exacerbation are also affecting patient's functional outcome.   REHAB POTENTIAL: Good - pt is young and his pain is not chronic  CLINICAL DECISION MAKING: Stable/uncomplicated  EVALUATION COMPLEXITY: Low  GOALS: Goals reviewed with patient? No  SHORT TERM GOALS: Target date: 08/04/2023    Patient will demonstrate independent understanding of HEP to be able effectively manage underlying MSK condition.   Baseline: Not tested Goal status: ONGOING     LONG TERM GOALS: Target date: 09/29/2023    Patient will  have improved function and activity level as evidenced by an increase in FOTO score by 10 points or more.   Baseline: 39 Goal status: ONGOING  2.  Pt will increase wrist and elbow strength by 1/3 grade MMT (i.e. 4- to 4) or more to assist in return to his work activities as a Lawyer Baseline:  Elbow (R/L) - extension: 4/4+; flexion (neutral forearm) 4-/4+; pronation: 4/5 Wrist (R/L) - extension:  3+/4+, radial deviation 4-/5 Goal status: ONGOING  3.  Pt will be able to grip with max effort without increasing pain >3/10 on NPRS in order to perform lifting and grabbing tasks associated with ADLs and job as a CNA Baseline: NT  Goal status: ONGOING    PLAN: PT FREQUENCY: 1-2x/week  PT DURATION: 10 weeks  PLANNED INTERVENTIONS: 97164- PT Re-evaluation, 97110-Therapeutic exercises, 97530- Therapeutic activity, 97112- Neuromuscular re-education, 97535- Self Care, 40981- Manual therapy, Dry Needling, and Joint mobilization  PLAN FOR NEXT SESSION: Continue resistance training of wrist extensors and radial deviators, focus on wrist extension while using other muscles  Arcelia Jew, Student-PT 08/15/2023, 2:42 PM

## 2023-08-16 ENCOUNTER — Ambulatory Visit: Payer: Commercial Managed Care - PPO | Admitting: Family Medicine

## 2023-08-16 NOTE — Progress Notes (Unsigned)
      Established patient visit   Patient: Edward Blake   DOB: [birth year]   37 y.o. Male  MRN: [id number] Visit Date: 08/16/2023  Today's healthcare provider: Ronnald Ramp, MD   No chief complaint on file.  Subjective       Discussed the use of AI scribe software for clinical note transcription with the patient, who gave verbal consent to proceed.  History of Present Illness             No past medical history on file.  Medications: Outpatient Medications Prior to Visit  Medication Sig   clonazePAM (KLONOPIN) 1 MG tablet TAKE 1 TABLET BY MOUTH TWICE A DAY AS NEEDED   ibuprofen (ADVIL) 600 MG tablet Take 600 mg by mouth every 6 (six) hours as needed.   methocarbamol (ROBAXIN) 750 MG tablet Take 750 mg by mouth 4 (four) times daily.   metoprolol succinate (TOPROL-XL) 25 MG 24 hr tablet TAKE 1 TABLET BY MOUTH DAILY   sertraline (ZOLOFT) 50 MG tablet Take 1-1.5 tablets (50-75 mg total) by mouth daily. (Patient not taking: Reported on 07/19/2023)   No facility-administered medications prior to visit.    Review of Systems  {Insert previous labs (optional):23779} {See past labs  Heme  Chem  Endocrine  Serology  Results Review (optional):1}   Objective    There were no vitals taken for this visit. {Insert last BP/Wt (optional):23777}{See vitals history (optional):1}    Physical Exam  ***  No results found for any visits on 08/16/23.  Assessment & Plan     Problem List Items Addressed This Visit       Cardiovascular and Mediastinum   Primary hypertension - Primary    Assessment and Plan              No follow-ups on file.         Ronnald Ramp, MD  [facility] [phone] (phone) [phone] (fax)  [facility]

## 2023-08-17 ENCOUNTER — Telehealth: Payer: Self-pay | Admitting: Physical Therapy

## 2023-08-17 ENCOUNTER — Ambulatory Visit: Payer: Commercial Managed Care - PPO | Admitting: Physical Therapy

## 2023-08-17 NOTE — Telephone Encounter (Signed)
Called pt to confirm apt. He reports that he has planned to decrease frequency down to 1x per week and that he will wait to schedule additional visits once he knows more about his upcoming work schedule.

## 2023-08-22 ENCOUNTER — Encounter: Payer: Self-pay | Admitting: Family Medicine

## 2023-08-22 ENCOUNTER — Ambulatory Visit: Payer: Commercial Managed Care - PPO | Admitting: Family Medicine

## 2023-08-22 VITALS — BP 126/71 | HR 74 | Resp 16 | Ht 75.0 in | Wt 180.5 lb

## 2023-08-22 DIAGNOSIS — M7711 Lateral epicondylitis, right elbow: Secondary | ICD-10-CM | POA: Diagnosis not present

## 2023-08-22 NOTE — Patient Instructions (Signed)
.   Please review the attached list of medications and notify my office if there are any errors.   . Please bring all of your medications to every appointment so we can make sure that our medication list is the same as yours.   

## 2023-08-22 NOTE — Progress Notes (Signed)
      Established patient visit   Patient: Edward Blake   DOB: [birth year]   37 y.o. Male  MRN: [id number] Visit Date: 08/22/2023  Today's healthcare provider: Mila Merry, MD   Chief Complaint  Patient presents with   Medical Management of Chronic Issues    HTN-return to work clearance   Subjective    Discussed the use of AI scribe software for clinical note transcription with the patient, who gave verbal consent to proceed.  History of Present Illness   The patient, a Education administrator (CNA), presents with a history of tennis elbow, which has caused significant discomfort and functional impairment, to the point of being unable to lift a bottle of water. The pain was primarily located in the muscle of the right elbow, with some discomfort extending to the lower arm. The patient has been on FMLA leave due to this condition and has been working with a physical therapist for management.  The patient reports resolution of symptoms, with residual soreness primarily after working out the affected area. He has been trying to balance activity to avoid overuse of the muscle. The patient is due to return to work and is seeking clearance. He also expresses interest in acquiring an elbow band for additional support and has questions about managing potential flare-ups, particularly in cold weather.       Medications: Outpatient Medications Prior to Visit  Medication Sig   clonazePAM (KLONOPIN) 1 MG tablet TAKE 1 TABLET BY MOUTH TWICE A DAY AS NEEDED   ibuprofen (ADVIL) 600 MG tablet Take 600 mg by mouth every 6 (six) hours as needed.   methocarbamol (ROBAXIN) 750 MG tablet Take 750 mg by mouth 4 (four) times daily.   metoprolol succinate (TOPROL-XL) 25 MG 24 hr tablet TAKE 1 TABLET BY MOUTH DAILY   sertraline (ZOLOFT) 50 MG tablet Take 1-1.5 tablets (50-75 mg total) by mouth daily. (Patient not taking: Reported on 07/19/2023)   No facility-administered medications prior to visit.       Objective    BP 126/71 (BP Location: Left Arm, Patient Position: Sitting, Cuff Size: Normal)   Pulse 74   Resp 16   Ht 6\' 3"  (1.905 m)   Wt 180 lb 8 oz (81.9 kg)   BMI 22.56 kg/m    Physical Exam   MUSCULOSKELETAL: Point tenderness localized to the lateral aspect of the elbow, with pain upon vigorous palpation. No significant pain on forearm rotation inward. Able to bend the arm with improved ease, indicating reduced discomfort and increased range of motion.    No results found for any visits on 08/22/23.  Assessment & Plan        Lateral epicondylitis (Right) Improved with physical therapy. Mild residual soreness, particularly after exercise. Discussed the importance of consistent and appropriate exercise, and the use of an elbow band for support. -Cleared to return to work as of 08/23/2023. -Advise to purchase and use an elbow band for support. -Continue with appropriate exercises as instructed by physical therapist. -Use ice for any flare-ups and consider over-the-counter ibuprofen if needed. -Scheduled for a physical next week.         Mila Merry, MD  [facility] [phone] (phone) [phone] (fax)  [facility]

## 2023-08-29 ENCOUNTER — Encounter: Payer: Commercial Managed Care - PPO | Admitting: Family Medicine

## 2023-09-12 ENCOUNTER — Other Ambulatory Visit: Payer: Self-pay | Admitting: Family Medicine

## 2023-09-12 DIAGNOSIS — F418 Other specified anxiety disorders: Secondary | ICD-10-CM

## 2023-11-02 ENCOUNTER — Other Ambulatory Visit: Payer: Self-pay | Admitting: Family Medicine

## 2023-11-02 DIAGNOSIS — I10 Essential (primary) hypertension: Secondary | ICD-10-CM

## 2023-11-30 ENCOUNTER — Encounter: Payer: Self-pay | Admitting: Family Medicine

## 2023-12-26 ENCOUNTER — Other Ambulatory Visit: Payer: Self-pay | Admitting: Family Medicine

## 2023-12-26 DIAGNOSIS — F418 Other specified anxiety disorders: Secondary | ICD-10-CM

## 2024-05-22 ENCOUNTER — Other Ambulatory Visit: Payer: Self-pay | Admitting: Family Medicine

## 2024-05-22 DIAGNOSIS — F418 Other specified anxiety disorders: Secondary | ICD-10-CM

## 2024-06-20 ENCOUNTER — Other Ambulatory Visit: Payer: Self-pay | Admitting: Medical Genetics

## 2024-06-20 DIAGNOSIS — Z006 Encounter for examination for normal comparison and control in clinical research program: Secondary | ICD-10-CM

## 2024-06-21 ENCOUNTER — Other Ambulatory Visit: Payer: Self-pay | Admitting: Family Medicine

## 2024-06-21 DIAGNOSIS — F418 Other specified anxiety disorders: Secondary | ICD-10-CM

## 2024-07-17 ENCOUNTER — Other Ambulatory Visit: Payer: Self-pay | Admitting: Family Medicine

## 2024-07-17 DIAGNOSIS — F418 Other specified anxiety disorders: Secondary | ICD-10-CM

## 2024-07-19 ENCOUNTER — Telehealth: Payer: Self-pay | Admitting: Family Medicine

## 2024-07-19 NOTE — Telephone Encounter (Signed)
 [facility] faxed refill request for the following medications:  clonazePAM  (KLONOPIN ) 1 MG tablet     Please advise.

## 2024-07-19 NOTE — Telephone Encounter (Signed)
 Duplicate request has been send to provider.

## 2024-08-20 ENCOUNTER — Telehealth: Payer: Self-pay | Admitting: Family Medicine

## 2024-08-20 DIAGNOSIS — F418 Other specified anxiety disorders: Secondary | ICD-10-CM

## 2024-08-21 NOTE — Telephone Encounter (Signed)
 Due for follow up appointment and needs to schedule before refill can be approved

## 2024-08-22 ENCOUNTER — Other Ambulatory Visit: Payer: Self-pay | Admitting: Family Medicine

## 2024-08-22 DIAGNOSIS — F418 Other specified anxiety disorders: Secondary | ICD-10-CM

## 2024-08-22 NOTE — Telephone Encounter (Unsigned)
 Copied from CRM #[id number]. Topic: Clinical - Medication Refill >> Aug 22, 2024  9:54 AM Gustabo D wrote: Medication: clonazePAM  (KLONOPIN ) 1 MG tablet  Has the patient contacted their pharmacy? Yes (Agent: If no, request that the patient contact the pharmacy for the refill. If patient does not wish to contact the pharmacy document the reason why and proceed with request.) (Agent: If yes, when and what did the pharmacy advise?)  This is the patient's preferred pharmacy:  [facility] [id number] [place], [place] - [address] [place] [address] Phone: [phone] Fax: [phone]  Is this the correct pharmacy for this prescription? Yes If no, delete pharmacy and type the correct one.   Has the prescription been filled recently? No  Is the patient out of the medication? Yes  Has the patient been seen for an appointment in the last year OR does the patient have an upcoming appointment? Yes  Can we respond through MyChart? Yes  Agent: Please be advised that Rx refills may take up to 3 business days. We ask that you follow-up with your pharmacy. >> Aug 22, 2024  9:55 AM Gustabo D wrote: Pt wants to know if this can be filled he is completely out and has a appt in Jan

## 2024-08-24 ENCOUNTER — Other Ambulatory Visit: Payer: Self-pay | Admitting: Family Medicine

## 2024-08-24 DIAGNOSIS — F418 Other specified anxiety disorders: Secondary | ICD-10-CM

## 2024-08-24 NOTE — Telephone Encounter (Signed)
 Requested medication (s) are due for refill today: no  Requested medication (s) are on the active medication list: yes  Last refill:  08/24/24  Future visit scheduled: yes  Notes to clinic:  duplicate request, unable to refuse.     Requested Prescriptions  Pending Prescriptions Disp Refills   clonazePAM  (KLONOPIN ) 1 MG tablet 30 tablet 0     Not Delegated - Psychiatry: Anxiolytics/Hypnotics 2 Failed - 08/24/2024  3:38 PM      Failed - This refill cannot be delegated      Failed - Urine Drug Screen completed in last 360 days      Failed - Valid encounter within last 6 months    Recent Outpatient Visits   None            Passed - Patient is not pregnant

## 2024-09-03 ENCOUNTER — Ambulatory Visit: Admitting: Family Medicine

## 2024-09-12 ENCOUNTER — Ambulatory Visit: Admitting: Family Medicine
